# Patient Record
Sex: Female | Born: 1975
Health system: Southern US, Community
[De-identification: ages and names within clinical notes are randomized; demographics above are authoritative.]

## PROBLEM LIST (undated history)

## (undated) DIAGNOSIS — F429 Obsessive-compulsive disorder, unspecified: Secondary | ICD-10-CM

## (undated) DIAGNOSIS — Z8619 Personal history of other infectious and parasitic diseases: Secondary | ICD-10-CM

## (undated) DIAGNOSIS — F32A Depression, unspecified: Secondary | ICD-10-CM

## (undated) DIAGNOSIS — M419 Scoliosis, unspecified: Secondary | ICD-10-CM

## (undated) DIAGNOSIS — S62102A Fracture of unspecified carpal bone, left wrist, initial encounter for closed fracture: Secondary | ICD-10-CM

## (undated) DIAGNOSIS — G43909 Migraine, unspecified, not intractable, without status migrainosus: Secondary | ICD-10-CM

## (undated) DIAGNOSIS — F329 Major depressive disorder, single episode, unspecified: Secondary | ICD-10-CM

## (undated) DIAGNOSIS — F419 Anxiety disorder, unspecified: Secondary | ICD-10-CM

## (undated) DIAGNOSIS — Z8719 Personal history of other diseases of the digestive system: Secondary | ICD-10-CM

## (undated) HISTORY — PX: UPPER GI ENDOSCOPY: SHX6162

## (undated) HISTORY — PX: WISDOM TOOTH EXTRACTION: SHX21

## (undated) HISTORY — DX: Obsessive-compulsive disorder, unspecified: F42.9

## (undated) HISTORY — PX: PILONIDAL CYST EXCISION: SHX744

## (undated) HISTORY — DX: Scoliosis, unspecified: M41.9

## (undated) HISTORY — DX: Fracture of unspecified carpal bone, left wrist, initial encounter for closed fracture: S62.102A

## (undated) HISTORY — DX: Personal history of other infectious and parasitic diseases: Z86.19

## (undated) HISTORY — PX: OTHER SURGICAL HISTORY: SHX169

## (undated) HISTORY — PX: DILATION AND CURETTAGE OF UTERUS: SHX78

## (undated) HISTORY — DX: Personal history of other diseases of the digestive system: Z87.19

## (undated) HISTORY — DX: Migraine, unspecified, not intractable, without status migrainosus: G43.909

---

## 1999-08-07 ENCOUNTER — Other Ambulatory Visit: Admission: RE | Admit: 1999-08-07 | Discharge: 1999-08-07 | Payer: Self-pay | Admitting: Internal Medicine

## 2000-10-10 ENCOUNTER — Other Ambulatory Visit: Admission: RE | Admit: 2000-10-10 | Discharge: 2000-10-10 | Payer: Self-pay | Admitting: Family Medicine

## 2005-02-03 ENCOUNTER — Ambulatory Visit: Payer: Self-pay | Admitting: Family Medicine

## 2005-03-03 ENCOUNTER — Ambulatory Visit: Payer: Self-pay | Admitting: Family Medicine

## 2005-09-02 ENCOUNTER — Ambulatory Visit: Payer: Self-pay | Admitting: Family Medicine

## 2006-03-21 ENCOUNTER — Ambulatory Visit: Payer: Self-pay | Admitting: Family Medicine

## 2006-04-20 ENCOUNTER — Ambulatory Visit: Payer: Self-pay | Admitting: Family Medicine

## 2006-08-29 ENCOUNTER — Ambulatory Visit: Payer: Self-pay | Admitting: Family Medicine

## 2007-01-31 ENCOUNTER — Ambulatory Visit: Payer: Self-pay | Admitting: Family Medicine

## 2007-06-12 ENCOUNTER — Ambulatory Visit: Payer: Self-pay | Admitting: Family Medicine

## 2009-04-13 ENCOUNTER — Emergency Department: Payer: Self-pay | Admitting: Emergency Medicine

## 2009-04-17 ENCOUNTER — Ambulatory Visit: Payer: Self-pay | Admitting: Unknown Physician Specialty

## 2009-10-24 ENCOUNTER — Ambulatory Visit: Payer: Self-pay | Admitting: Family Medicine

## 2009-10-24 DIAGNOSIS — Z8719 Personal history of other diseases of the digestive system: Secondary | ICD-10-CM | POA: Insufficient documentation

## 2009-10-24 DIAGNOSIS — B2790 Infectious mononucleosis, unspecified without complication: Secondary | ICD-10-CM | POA: Insufficient documentation

## 2009-10-24 DIAGNOSIS — M4120 Other idiopathic scoliosis, site unspecified: Secondary | ICD-10-CM | POA: Insufficient documentation

## 2009-10-24 DIAGNOSIS — F411 Generalized anxiety disorder: Secondary | ICD-10-CM | POA: Insufficient documentation

## 2009-10-24 DIAGNOSIS — F341 Dysthymic disorder: Secondary | ICD-10-CM

## 2009-10-28 ENCOUNTER — Telehealth (INDEPENDENT_AMBULATORY_CARE_PROVIDER_SITE_OTHER): Payer: Self-pay | Admitting: Internal Medicine

## 2009-11-25 ENCOUNTER — Ambulatory Visit: Payer: Self-pay | Admitting: Family Medicine

## 2009-11-25 ENCOUNTER — Telehealth (INDEPENDENT_AMBULATORY_CARE_PROVIDER_SITE_OTHER): Payer: Self-pay | Admitting: Internal Medicine

## 2009-12-25 ENCOUNTER — Encounter: Payer: Self-pay | Admitting: Family Medicine

## 2009-12-25 ENCOUNTER — Ambulatory Visit: Payer: Self-pay | Admitting: Family Medicine

## 2009-12-25 DIAGNOSIS — R5381 Other malaise: Secondary | ICD-10-CM | POA: Insufficient documentation

## 2009-12-25 DIAGNOSIS — R5383 Other fatigue: Secondary | ICD-10-CM | POA: Insufficient documentation

## 2009-12-25 LAB — CONVERTED CEMR LAB
BUN: 9 mg/dL (ref 6–23)
Basophils Absolute: 0 10*3/uL (ref 0.0–0.1)
Chloride: 98 meq/L (ref 96–112)
Glucose, Bld: 85 mg/dL (ref 70–99)
HCT: 41.1 % (ref 36.0–46.0)
Lymphs Abs: 2.7 10*3/uL (ref 0.7–4.0)
MCV: 98.2 fL (ref 78.0–100.0)
Monocytes Absolute: 0.6 10*3/uL (ref 0.1–1.0)
Platelets: 337 10*3/uL (ref 150.0–400.0)
Potassium: 4.2 meq/L (ref 3.5–5.1)
RDW: 11.6 % (ref 11.5–14.6)
TSH: 2.57 microintl units/mL (ref 0.35–5.50)

## 2009-12-29 ENCOUNTER — Ambulatory Visit: Payer: Self-pay | Admitting: Family Medicine

## 2009-12-29 ENCOUNTER — Telehealth: Payer: Self-pay | Admitting: Family Medicine

## 2009-12-29 DIAGNOSIS — G43809 Other migraine, not intractable, without status migrainosus: Secondary | ICD-10-CM | POA: Insufficient documentation

## 2010-01-02 ENCOUNTER — Telehealth: Payer: Self-pay | Admitting: Family Medicine

## 2010-01-07 ENCOUNTER — Encounter: Payer: Self-pay | Admitting: Family Medicine

## 2010-01-28 ENCOUNTER — Encounter: Payer: Self-pay | Admitting: Family Medicine

## 2010-02-18 ENCOUNTER — Ambulatory Visit: Payer: Self-pay | Admitting: Family Medicine

## 2010-02-25 ENCOUNTER — Telehealth: Payer: Self-pay | Admitting: Family Medicine

## 2010-02-25 ENCOUNTER — Ambulatory Visit: Payer: Self-pay | Admitting: Family Medicine

## 2010-02-25 DIAGNOSIS — N926 Irregular menstruation, unspecified: Secondary | ICD-10-CM | POA: Insufficient documentation

## 2010-02-26 ENCOUNTER — Ambulatory Visit: Payer: Self-pay | Admitting: Family Medicine

## 2010-02-26 ENCOUNTER — Encounter: Payer: Self-pay | Admitting: Family Medicine

## 2010-03-03 ENCOUNTER — Ambulatory Visit: Payer: Self-pay | Admitting: Internal Medicine

## 2010-03-03 DIAGNOSIS — M549 Dorsalgia, unspecified: Secondary | ICD-10-CM | POA: Insufficient documentation

## 2010-03-04 ENCOUNTER — Telehealth: Payer: Self-pay | Admitting: Internal Medicine

## 2010-03-04 ENCOUNTER — Encounter: Payer: Self-pay | Admitting: Internal Medicine

## 2010-04-17 ENCOUNTER — Telehealth: Payer: Self-pay | Admitting: Family Medicine

## 2010-05-01 ENCOUNTER — Telehealth: Payer: Self-pay | Admitting: Internal Medicine

## 2010-06-02 ENCOUNTER — Telehealth: Payer: Self-pay | Admitting: Family Medicine

## 2010-07-16 ENCOUNTER — Telehealth: Payer: Self-pay | Admitting: Family Medicine

## 2010-07-21 ENCOUNTER — Ambulatory Visit: Payer: Self-pay | Admitting: Family Medicine

## 2010-07-21 DIAGNOSIS — J019 Acute sinusitis, unspecified: Secondary | ICD-10-CM | POA: Insufficient documentation

## 2010-08-20 ENCOUNTER — Ambulatory Visit: Payer: Self-pay | Admitting: Family Medicine

## 2010-08-20 DIAGNOSIS — I10 Essential (primary) hypertension: Secondary | ICD-10-CM | POA: Insufficient documentation

## 2010-08-20 DIAGNOSIS — R030 Elevated blood-pressure reading, without diagnosis of hypertension: Secondary | ICD-10-CM | POA: Insufficient documentation

## 2010-08-21 ENCOUNTER — Encounter: Payer: Self-pay | Admitting: Family Medicine

## 2010-08-21 LAB — CONVERTED CEMR LAB
BUN: 18 mg/dL (ref 6–23)
Chloride: 107 meq/L (ref 96–112)
Creatinine, Ser: 0.6 mg/dL (ref 0.4–1.2)
GFR calc non Af Amer: 113.42 mL/min (ref >60)

## 2010-09-11 ENCOUNTER — Emergency Department: Payer: Self-pay | Admitting: Emergency Medicine

## 2010-09-21 ENCOUNTER — Encounter: Payer: Self-pay | Admitting: Family Medicine

## 2010-12-28 ENCOUNTER — Ambulatory Visit
Admission: RE | Admit: 2010-12-28 | Discharge: 2010-12-28 | Payer: Self-pay | Source: Home / Self Care | Attending: Family Medicine | Admitting: Family Medicine

## 2010-12-28 ENCOUNTER — Encounter: Payer: Self-pay | Admitting: Family Medicine

## 2010-12-28 DIAGNOSIS — F4010 Social phobia, unspecified: Secondary | ICD-10-CM | POA: Insufficient documentation

## 2011-01-08 ENCOUNTER — Telehealth: Payer: Self-pay | Admitting: Family Medicine

## 2011-01-14 NOTE — Progress Notes (Signed)
Summary: refill request for vicodin  Phone Note Refill Request Call back at Home Phone [phone] Message from:  Patient  Refills Requested: Medication #1:  VICODIN 5-500 MG TABS 1 tab every 6 hours as needed for pain Phoned request from pt, she says she takes this for menstrual pain.  Uses [facility] [address].  Please let pt know.  Initial call taken by: Lowella Petties CMA,  Apr 17, 2010 11:28 AM  Follow-up for Phone Call        Medication phoned to pharmacy. Patient Advised.  Follow-up by: Delilah Shan CMA Duncan Dull),  Apr 17, 2010 2:36 PM    Prescriptions: VICODIN 5-500 MG TABS (HYDROCODONE-ACETAMINOPHEN) 1 tab every 6 hours as needed for pain  #30 x 0   Entered and Authorized by:   Ruthe Mannan MD   Signed by:   Ruthe Mannan MD on 04/17/2010   Method used:   Historical   RxID:   [id number]

## 2011-01-14 NOTE — Progress Notes (Signed)
Summary: ? referral to psychiatrist  Phone Note Call from Patient Call back at Home Phone [phone]   Caller: Patient Call For: Ruthe Mannan MD Summary of Call: Pt is seeing a therapist who recommends pt see a psychiatrist to manage her anxiety meds.  Pt is asking if she needs to do that or can you manage her meds.  If not, she will need a referral to psychiatrist in [place], and, do you want to see her first? Initial call taken by: Lowella Petties CMA,  June 02, 2010 12:16 PM  Follow-up for Phone Call        I would like to see her first. Please schedule a 30 min appt to discuss. Ruthe Mannan MD  June 02, 2010 1:09 PM   Additional Follow-up for Phone Call Additional follow up Details #1::        Appt made. Additional Follow-up by: Lowella Petties CMA,  June 02, 2010 2:04 PM

## 2011-01-14 NOTE — Progress Notes (Signed)
Summary: Terrible headache  Phone Note Call from Patient Call back at [phone]   Caller: Patient Call For: Dr. Dayton Martes Summary of Call: Patient was in last week and had lab work done. Patient went back to work this morning and had to leave because of a terrible headache and feeling fatigued. Patient wants to know if you have gotten her lab work back? Patient wants to know if she needs to come back in to see you or what should she do? Initial call taken by: Sydell Axon LPN,  December 29, 2009 8:48 AM  Follow-up for Phone Call        All labs were normal, results were called to phone tree.  She can come back in if not feeling well. Follow-up by: Ruthe Mannan MD,  December 29, 2009 8:55 AM  Additional Follow-up for Phone Call Additional follow up Details #1::        Patient Advised.   Call transferred to [facility] to make appt. Additional Follow-up by: Delilah Shan CMA (AAMA),  December 29, 2009 9:07 AM

## 2011-01-14 NOTE — Letter (Signed)
Summary: Generic Letter  [place] at [facility]  [address] [place], [place] [address]   Phone: [phone]  Fax: [phone]    08/21/2010  Bryony BAKKEN [address] [place], [place]  [address]  Dear Ms. Marciel,   We have received your lab results and your Electrolytes and Kidney function are normal.  Enclosed you will find a copy of your lab results.         Sincerely,       Ruthe Mannan, MD

## 2011-01-14 NOTE — Assessment & Plan Note (Signed)
Summary: BACK PAIN NOT DOING ANY BETTER/RBH   Vital Signs:  Patient profile:   35 year old female Height:      62.5 inches Weight:      213.38 pounds BMI:     38.54 Temp:     98.8 degrees F oral Pulse rate:   80 / minute Pulse rhythm:   regular BP sitting:   108 / 82  (left arm) Cuff size:   large  Vitals Entered By: Delilah Shan CMA Duncan Dull) (February 25, 2010 11:30 AM) CC: Back pain no better   History of Present Illness: 35 yo here for back pain with menstruation.  Over past few months, periods have been heavier, more irregular. Now also getting severe back pain (central lumbar) during her period. LMP was last week.  Felt this time was the worst. Also has h/o two prior miscarriages, last one in 11/10. No dysuria or vaginal discharge.  Also has chronic low back pain that is mainly on her left side. The pain she describes today that occurs with her periods is very different from her chronic LBP. No radiculopathy.  Current Medications (verified): 1)  Zoloft 50 Mg  Tabs (Sertraline Hcl) .... Take 1 Tablet By Mouth Once A Day 2)  Imitrex 100 Mg Tabs (Sumatriptan Succinate) .Marland Kitchen.. 1 By Mouth At The Onset of Migraine. May Repeat Dose in One Hour If Headache Not Resolved 3)  Topamax 25 Mg Tabs (Topiramate) .... Take 4 Tablets By Mouth At Bedtime 4)  Nabumetone 750 Mg Tabs (Nabumetone) .... As Needed 5)  Vicodin 5-500 Mg Tabs (Hydrocodone-Acetaminophen) .... As Needed 6)  Alprazolam 0.25 Mg Tabs (Alprazolam) .Marland Kitchen.. 1 Tab By Mouth Four Times Daily As Needed For Anxiety.  Allergies: 1)  ! Pcn  Review of Systems      See HPI General:  Denies chills and fever. GI:  Denies bloody stools. GU:  Complains of abnormal vaginal bleeding; denies decreased libido, discharge, dysuria, genital sores, incontinence, urinary frequency, and urinary hesitancy. MS:  Complains of low back pain; denies muscle weakness.  Physical Exam  General:  alert, NAD, Abdomen:  Bowel sounds positive,abdomen soft  and non-tender without masses, organomegaly or hernias noted.   Detailed Back/Spine Exam  Lumbosacral Exam:  Inspection-deformity:    Normal Palpation-spinal tenderness:  Normal Lying Straight Leg Raise:    Right:  negative    Left:  negative Sitting Straight Leg Raise:    Right:  negative    Left:  negative Reverse Straight Leg Raise:    Right:  negative    Left:  negative Patrick's Maneuver:    Right:  negative    Left:  negative Fabere Test:    Right:  negative    Left:  negative   Impression & Recommendations:  Problem # 1:  IRREGULAR MENSTRUAL CYCLE (ICD-626.4) Assessment New with low back pain. Given h/o infertility, miscarriages and changes in menstruation, I do think this warrants further work up. Fibroids vs endometriosis. Will get tranvaginal/pelvic ultrasound but may need Gyn referral as well. May also be a component of PCOS.  No signs of glucose intolerance at this point. Orders: Radiology Referral (Radiology)  Complete Medication List: 1)  Zoloft 50 Mg Tabs (Sertraline hcl) .... Take 1 tablet by mouth once a day 2)  Imitrex 100 Mg Tabs (Sumatriptan succinate) .Marland Kitchen.. 1 by mouth at the onset of migraine. may repeat dose in one hour if headache not resolved 3)  Topamax 25 Mg Tabs (Topiramate) .... Take 4 tablets by mouth  at bedtime 4)  Nabumetone 750 Mg Tabs (Nabumetone) .... As needed 5)  Vicodin 5-500 Mg Tabs (Hydrocodone-acetaminophen) .... As needed 6)  Alprazolam 0.25 Mg Tabs (Alprazolam) .Marland Kitchen.. 1 tab by mouth four times daily as needed for anxiety.  Patient Instructions: 1)  Please stop by to see Shirlee Limerick on your way out. Prescriptions: VICODIN 5-500 MG TABS (HYDROCODONE-ACETAMINOPHEN) as needed  #30 x 0   Entered and Authorized by:   Ruthe Mannan MD   Signed by:   Ruthe Mannan MD on 02/25/2010   Method used:   Print then Give to Patient   RxID:   [id number]   Current Allergies (reviewed today): ! PCN

## 2011-01-14 NOTE — Progress Notes (Signed)
Summary: No show for P/T appt  Phone Note Outgoing Call   Summary of Call: No showed for P/T appt.Marland KitchenMarland KitchenCalled pt na.Daine Gip  May 01, 2010 2:40 PM Initial call taken by: Daine Gip,  May 01, 2010 2:40 PM  Follow-up for Phone Call        noted Follow-up by: Cindee Salt MD,  May 01, 2010 3:05 PM

## 2011-01-14 NOTE — Assessment & Plan Note (Signed)
Summary: BACK PROBLEMS/ ALC   Vital Signs:  Patient profile:   35 year old female Weight:      211 pounds Temp:     98.4 degrees F oral Pulse rate:   60 / minute Pulse rhythm:   regular BP sitting:   140 / 80  (left arm) Cuff size:   large  Vitals Entered By: Mervin Hack CMA Duncan Dull) (March 03, 2010 11:49 AM) CC: back pain   History of Present Illness: Back "is killing me" Goes back to 3/9 and it started with her menses  Now menses done but the back pain has continued seen by Dr Dayton Martes had ultrasound---just ovarian cyst  back pain is worsening Trying topical lotion (like bengay) Mom puts it on and has noted a bulge there  Pain if sitting, lying, standing---never comfortable has tried back stretching  "no pain tolerance"--will actually make her nauseated  Tries to limit vicodin to one per day  No leg weakness No radiation of pain up back or down legs---just across back at same level  Allergies: 1)  ! Pcn  Past History:  Past medical, surgical, family and social histories (including risk factors) reviewed for relevance to current acute and chronic problems.  Past Medical History: Reviewed history from 10/24/2009 and no changes required. Pancreatitis, hx of Scoliosis (mild) mononucleosis as a teenager OCD  Past Surgical History: Reviewed history from 10/24/2009 and no changes required. Left wrist fx. Pilonidal cyst  Family History: Reviewed history from 10/24/2009 and no changes required. Father:  Mother: --depression MGM--depression Siblings:   Social History: Reviewed history from 10/24/2009 and no changes required. 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   Msk:  exquisite tenderness over lumbar spine and lumbar  paraspinals bilaterally (seems to be her pain sensitivity rather than true point tenderness) SLR negative bilat Neurologic:  strength normal in all extremities.   Gait slow but normal   Impression & Recommendations:  Problem # 1:  BACK PAIN (ICD-724.5) Assessment Comment Only  ongoing needs to avoid pain meds best in AM and worsens during day--may have some spasm  will try chlorzoxazone (shouldn't have rebound headache from this) will make PT referral  Her updated medication list for this problem includes:    Nabumetone 750 Mg Tabs (Nabumetone) .Marland Kitchen... As needed    Vicodin 5-500 Mg Tabs (Hydrocodone-acetaminophen) .Marland Kitchen... 1 tab every 6 hours as needed for pain    Chlorzoxazone 500 Mg Tabs (Chlorzoxazone) .Marland Kitchen... 1 tab by mouth three times a day as needed for muscle spasm  Orders: Physical Therapy Referral (PT)  Complete Medication List: 1)  Zoloft 50 Mg Tabs (Sertraline hcl) .... Take 1 tablet by mouth once a day 2)  Imitrex 100 Mg Tabs (Sumatriptan succinate) .Marland Kitchen.. 1 by mouth at the onset of migraine. may repeat dose in one hour if headache not resolved 3)  Topamax 25 Mg Tabs (Topiramate) .... Take 4 tablets by mouth at bedtime 4)  Nabumetone 750 Mg Tabs (Nabumetone) .... As needed 5)  Vicodin 5-500 Mg Tabs (Hydrocodone-acetaminophen) .Marland Kitchen.. 1 tab every 6 hours as needed for pain 6)  Alprazolam 0.25 Mg Tabs (Alprazolam) .Marland Kitchen.. 1 tab by mouth four times daily as needed for anxiety. 7)  Chlorzoxazone 500 Mg Tabs (Chlorzoxazone) .Marland Kitchen.. 1 tab by mouth  three times a day as needed for muscle spasm  Patient Instructions: 1)  Please schedule a follow-up appointment as needed .  2)  Referral Appointment Information 3)  Day/Date: 4)  Time: 5)  Place/MD: 6)  Address: 7)  Phone/Fax: 8)  Patient given appointment information. Information/Orders faxed/mailed. Prescriptions: CHLORZOXAZONE 500 MG TABS (CHLORZOXAZONE) 1 tab by mouth three times a day as needed for muscle spasm  #60 x 0   Entered and  Authorized by:   Cindee Salt MD   Signed by:   Cindee Salt MD on 03/03/2010   Method used:   Electronically to        [facility] [address] (retail)       [address], [address]       [place], [place]  [address]       Ph: [phone]       Fax: [phone]   RxID:   [id number]   Current Allergies (reviewed today): ! PCN

## 2011-01-14 NOTE — Progress Notes (Signed)
Summary: Needs note  Phone Note Call from Patient Call back at Home Phone [phone]   Caller: Patient Call For: Dr. Alphonsus Sias Summary of Call: pt would like for you to "write" her out of work for Tuesday 03/03/2010 and Friday 03/06/2010, her normal days off are Wednesday and Thursday,  pt needs this note to use for FMLA. Pt states she works at [employer] and sits down 10 hours a day and not able to get up, she states her appt with "rehab" is Wednesday? Please advise. Initial call taken by: Mervin Hack CMA Duncan Dull),  March 04, 2010 10:13 AM  Follow-up for Phone Call        okay to write note for the 2 days return to work date Saturday the 26th Follow-up by: Cindee Salt MD,  March 04, 2010 11:00 AM  Additional Follow-up for Phone Call Additional follow up Details #1::        spoke with pt and she will pick up work note, also pt wants to to fill out FMLA for her being out this week. Please advise. DeShannon Smith CMA Duncan Dull)  March 04, 2010 2:43 PM   per Dr. Dayton Martes, she has spoken to pt about FMLA and Dr. Dayton Martes declined. She did fill out paperwork in January for [employer] for temp disability. DeShannon Smith CMA Duncan Dull)  March 04, 2010 4:39 PM   I agree. I was willing to say she was hurt, but FMLA papers are another thing. If they have to be done (and some jobs requiire it) there will be an extra charge to do them Cindee Salt MD  March 05, 2010 4:58 PM   spoke with pt and she is willing to pay the charge. DeShannon Smith CMA Duncan Dull)  March 06, 2010 9:06 AM   I will fill out the papers for the 2 days we agreed to have her out and no more than that Have her fax or drop by the papers--I can probably get to them  by the middle of the week Cindee Salt MD  March 06, 2010 5:39 PM   left message with results, will wait for pt to drop forms off. Additional Follow-up by: Mervin Hack CMA Duncan Dull),  March 10, 2010 11:35 AM

## 2011-01-14 NOTE — Assessment & Plan Note (Signed)
Summary: constant headache/alc   Vital Signs:  Patient profile:   35 year old female Height:      62.5 inches Weight:      218.25 pounds BMI:     39.42 Temp:     97.8 degrees F oral Pulse rate:   76 / minute Pulse rhythm:   regular BP sitting:   102 / 60  (right arm) Cuff size:   large  Vitals Entered By: Linde Gillis CMA Duncan Dull) (July 21, 2010 3:51 PM) CC: headaches   History of Present Illness: has a new headache  this does not feel like a migraine  has had it since last thursday -- but wed was the peak  had to sleep with the heating pad on her head  feels clammy and sweaty and nauseated -- this comes in waves  is not constant - but worse when she gets up and moves around   usually the migraine is just pain/ sens to light and sound and some nausea  no vomiting   the nausea is worse - and eating makes it worse   bp is no higher than normal   just ended period  usually gets more headaches when she is on it - but never this long   has taken vicodin for this headache  imitrex makes it worse -- so does not take it  took nabumetone -- did not help   also feels a lot of pressure in her head - has never had before  whole head (migraine is usually one sided )   feels good to push on her head   could be a sinus infection - is wondering about that  does have pressure - especially in cheeks and behind eyes  gritting her teeth a lot  in am quite congested -- ? allergies  some green drainage - all in the am  snoring is worse too    not been sleepig well -- more stress from that   is itching a bit and has a rash  ? if from the vicodin  no muscle relaxer lately        Allergies: 1)  ! Pcn  Past History:  Past Medical History: Last updated: 10/24/2009 Pancreatitis, hx of Scoliosis (mild) mononucleosis as a teenager OCD  Past Surgical History: Last updated: 10/24/2009 Left wrist fx. 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 Eyes:  Denies blurring, discharge, and eye irritation. CV:  Denies chest pain or discomfort, palpitations, and swelling of feet. Resp:  Denies cough and wheezing. GI:  Denies abdominal pain, change in bowel habits, and indigestion. MS:  Denies cramps and muscle weakness. Derm:  Denies itching, lesion(s), poor wound healing, and rash. Neuro:  Complains of headaches; denies numbness, tingling, tremors, and weakness. Psych:  mood is generally ok . Heme:  Denies abnormal bruising and bleeding.  Physical Exam  General:  overweight but generally well appearing  Head:  normocephalic, atraumatic, and no abnormalities observed.  bilat ethmoid/ frontal and maxillary sinus tenderness no temporal tenderness  Eyes:  vision grossly intact, pupils equal, pupils round, and pupils reactive to light.  no conjunctival pallor, injection or icterus  Ears:  R ear  normal and L ear normal.   Nose:  nares are injected and congested bilaterally  Mouth:  pharynx pink and moist.   Neck:  No deformities, masses, or tenderness noted. Lungs:  Normal respiratory effort, chest expands symmetrically. Lungs are clear to auscultation, no crackles or wheezes. Heart:  Normal rate and regular rhythm. S1 and S2 normal without gallop, murmur, click, rub or other extra sounds. Msk:  No deformity or scoliosis noted of thoracic or lumbar spine.   Pulses:  R and L carotid,radial,femoral,dorsalis pedis and posterior tibial pulses are full and equal bilaterally Extremities:  No clubbing, cyanosis, edema, or deformity noted with normal full range of motion of all joints.   Neurologic:  alert &  oriented X3, cranial nerves II-XII intact, strength normal in all extremities, sensation intact to light touch, gait normal, DTRs symmetrical and normal, finger-to-nose normal, and Romberg negative.   Skin:  Intact without suspicious lesions or rashes Cervical Nodes:  No lymphadenopathy noted Psych:  normal affect, talkative and pleasant -- but is in discomfort   Impression & Recommendations:  Problem # 1:  SINUSITIS - ACUTE-NOS (ICD-461.9) Assessment New pcn allergic  tx with zithromax  toradol inj for headache  Her updated medication list for this problem includes:    Zithromax Z-pak 250 Mg Tabs (Azithromycin) .Marland Kitchen... Take by mouth as directed  Problem # 2:  MIGRAINE VARIANT (ICD-346.20) Assessment: Deteriorated  suspect this is being triggered by above  will try 60 mg toradol injection - hopefully to break cycle  if not improved after tx of sinusitis-will f/u with her neurologist  Her updated medication list for this problem includes:    Imitrex 100 Mg Tabs (Sumatriptan succinate) .Marland Kitchen... 1 by mouth at the onset of migraine. may repeat dose in one hour if headache not resolved    Nabumetone 750 Mg Tabs (Nabumetone) .Marland Kitchen... As needed    Vicodin 5-500 Mg Tabs (Hydrocodone-acetaminophen) .Marland Kitchen... 1 tab every 6 hours as needed for pain  Orders: Ketorolac-Toradol 15mg  (Z6109) Admin of Therapeutic Inj  intramuscular or subcutaneous ([address])  Complete Medication List: 1)  Zoloft 50 Mg Tabs (Sertraline hcl) .... Take 1 tablet by mouth once a day 2)  Imitrex 100 Mg Tabs (Sumatriptan succinate) .Marland Kitchen.. 1 by mouth at the onset of migraine. may repeat dose in one hour if headache not resolved 3)  Topamax 25 Mg Tabs (Topiramate) .... Take 4 tablets by mouth at bedtime 4)  Nabumetone 750 Mg Tabs (Nabumetone) .... As needed 5)  Vicodin 5-500 Mg Tabs (Hydrocodone-acetaminophen) .Marland Kitchen.. 1 tab every 6 hours as needed for pain 6)  Alprazolam 0.25 Mg Tabs (Alprazolam) .Marland Kitchen.. 1 tab by mouth four times daily as  needed for anxiety. 7)  Chlorzoxazone 500 Mg Tabs (Chlorzoxazone) .Marland Kitchen.. 1 tab by mouth three times a day as needed for muscle spasm 8)  Zithromax Z-pak 250 Mg Tabs (Azithromycin) .... Take by mouth as directed  Patient Instructions: 1)  drink lots of fluids - sip fluids all day long  2)  use dove soap to bathe  3)  use lubriderm or eucerin moisturizer on arms  4)  use nasal saline spray to flush sinuses (or netti pot )  5)  take zithromax as directed for sinus infection 6)  shot of toradol today (anti inflammatory)  7)  if headache gets severe -- go to ER 8)  if not improved after the zithromax - call Dr Glade Stanford office for follow up  Prescriptions: ZITHROMAX Z-PAK 250 MG TABS (AZITHROMYCIN) take by mouth  as directed  #1 pack x 0   Entered and Authorized by:   Judith Part MD   Signed by:   Judith Part MD on 07/21/2010   Method used:   Electronically to        [facility] [address] (retail)       [address], [address]       [place], [place]  [address]       Ph: [phone]       Fax: [phone]   RxID:   [id number]   Current Allergies (reviewed today): ! PCN   Medication Administration  Injection # 1:    Medication: Ketorolac-Toradol 15mg     Diagnosis: MIGRAINE VARIANT (ICD-346.20)    Route: IM    Site: L deltoid    Exp Date: 02/11/2012    Lot #: [id number]    Mfr: Novaplus    Comments: Patient requested that this medication be given to her in the arm    Patient tolerated injection without complications    Given by: Linde Gillis CMA Duncan Dull) (July 21, 2010 5:05 PM)  Orders Added: 1)  Ketorolac-Toradol 15mg  [J1885] 2)  Admin of Therapeutic Inj  intramuscular or subcutaneous [96372] 3)  Est. Patient Level IV [23557]

## 2011-01-14 NOTE — Progress Notes (Signed)
Summary: Med Leave Extended  Phone Note Call from Patient Call back at Home Phone [phone]   Caller: Patient Call For: Dr. Dayton Martes Complaint: Abdominal Pain Summary of Call: Imitrex making her nauseous and has diarrhea real bad.  [employer] is asking for information to extend her leave from work.  They need  OV's showing the course that she is taking to resolve the headaches and her next appointment faxed to [phone]   Claim # [id number].  Is it okay to do this? Initial call taken by: Delilah Shan CMA Duncan Dull),  January 02, 2010 9:41 AM  Follow-up for Phone Call        yes. Follow-up by: Ruthe Mannan MD,  January 02, 2010 9:42 AM  Additional Follow-up for Phone Call Additional follow up Details #1::        info faxed to above Number with Case # identified. Additional Follow-up by: Delilah Shan CMA (AAMA),  January 02, 2010 9:59 AM

## 2011-01-14 NOTE — Letter (Signed)
Summary: [facility]  [facility]   Imported By: Lanelle Bal 02/06/2010 09:11:51  _____________________________________________________________________  External Attachment:    Type:   Image     Comment:   External Document

## 2011-01-14 NOTE — Consult Note (Signed)
Summary: [facility]  [facility]   Imported By: Maryln Gottron 01/19/2010 15:41:23  _____________________________________________________________________  External Attachment:    Type:   Image     Comment:   External Document

## 2011-01-14 NOTE — Letter (Signed)
Summary: Out of Work  [facility] [employer] [facility] at [facility]  [address] [place], [place] [address]   Phone: [phone]  Fax: [phone]    March 04, 2010   Employee:  Hannah Stephenson    To Whom It May Concern:   For Medical reasons, please excuse the above named employee from work for the following dates:  Start:   03/03/2010  End:   03/07/2010  If you need additional information, please feel free to contact our office.         Sincerely,      Tillman Abide, MD

## 2011-01-14 NOTE — Assessment & Plan Note (Signed)
Summary: DISCUSS MED FOR ANXIETY   Vital Signs:  Patient profile:   35 year old female Height:      62.5 inches Weight:      211 pounds BMI:     38.11 Temp:     98.7 degrees F oral Pulse rate:   92 / minute Pulse rhythm:   regular BP sitting:   102 / 76  (left arm) Cuff size:   large  Vitals Entered By: Delilah Shan CMA Duncan Dull) (February 18, 2010 11:48 AM) CC: Discuss medication for anxiety   History of Present Illness: Here for f.u headaches and anxiety.    HA- referred her to Dr. Clarisse Gouge (HA center) last office vist. She is now on Topomax 100 mg at bedtime and Relafen 750 mg qid prn.  Has not had a HA in over a week.  Feels like a new person.  Anxiety- better on Zoloft.  Dr. Clarisse Gouge mentioned increasing  her Zoloft (currently on 50 mg daily) as she does get panic attacks.  Last panic attack this weekend, gets one every few months.  Usually after driving or in a crowded area.  No SI or HI and depressive symptoms much improved. She feels great but still has panic attacks from time to time.  Current Medications (verified): 1)  Zoloft 50 Mg  Tabs (Sertraline Hcl) .... Take 1 Tablet By Mouth Once A Day 2)  Imitrex 100 Mg Tabs (Sumatriptan Succinate) .Marland Kitchen.. 1 By Mouth At The Onset of Migraine. May Repeat Dose in One Hour If Headache Not Resolved 3)  Topamax 25 Mg Tabs (Topiramate) .... Take 4 Tablets By Mouth At Bedtime 4)  Nabumetone 750 Mg Tabs (Nabumetone) .... As Needed 5)  Vicodin 5-500 Mg Tabs (Hydrocodone-Acetaminophen) .... As Needed 6)  Alprazolam 0.25 Mg Tabs (Alprazolam) .Marland Kitchen.. 1 Tab By Mouth Four Times Daily As Needed For Anxiety.  Allergies: 1)  ! Pcn  Review of Systems      See HPI General:  Denies chills and fever. Eyes:  Denies blurring. ENT:  Denies decreased hearing. CV:  Denies chest pain or discomfort. GI:  Denies abdominal pain. MS:  Denies muscle weakness. Neuro:  Denies headaches, seizures, sensation of room spinning, tingling, tremors, visual disturbances, and  weakness. Psych:  Complains of anxiety; denies depression, sense of great danger, suicidal thoughts/plans, thoughts of violence, unusual visions or sounds, and thoughts /plans of harming others.  Physical Exam  General:  alert, NAD, smiling, looks much better! Ears:  External ear exam shows no significant lesions or deformities.  Otoscopic examination reveals clear canals, tympanic membranes are intact bilaterally without bulging, retraction, inflammation or discharge. Hearing is grossly normal bilaterally. Mouth:  MMM Lungs:  Normal respiratory effort, chest expands symmetrically. Lungs are clear to auscultation, no crackles or wheezes. Heart:  Normal rate and regular rhythm. S1 and S2 normal without gallop, murmur, click, rub or other extra sounds. Psych:  Cognition and judgment appear intact. Alert and cooperative with normal attention span and concentration. No apparent delusions, illusions, hallucinations   Impression & Recommendations:  Problem # 1:  MIGRAINE VARIANT (ICD-346.20) Assessment Improved Much improved.  Continue current meds and recs per Dr. Clarisse Gouge. The following medications were removed from the medication list:    Ibuprofen 200 Mg Tabs (Ibuprofen) ..... Otc as directed.    Excedrin Migraine 250-250-65 Mg Tabs (Aspirin-acetaminophen-caffeine)    Excedrin Tension Headache 500-65 Mg Tabs (Acetaminophen-caffeine) Her updated medication list for this problem includes:    Imitrex 100 Mg Tabs (Sumatriptan  succinate) .Marland Kitchen... 1 by mouth at the onset of migraine. may repeat dose in one hour if headache not resolved    Nabumetone 750 Mg Tabs (Nabumetone) .Marland Kitchen... As needed    Vicodin 5-500 Mg Tabs (Hydrocodone-acetaminophen) .Marland Kitchen... As needed  Problem # 2:  ANXIETY DEPRESSION (ICD-300.4) Assessment: Improved Time spent with patient 25 minutes, more than 50% of this time was spent counseling patient on anxiety and depression.  She does not want to increase Zoloft because on most days she  feels great.  Does not want to be overly medicated.  Discussed adding low dose benzo as needed.  Given patient handout about xanax and discussed addiction potential.  She would like to try Xanax for breakthrough panic attacks.  Follow up in one month.  Complete Medication List: 1)  Zoloft 50 Mg Tabs (Sertraline hcl) .... Take 1 tablet by mouth once a day 2)  Imitrex 100 Mg Tabs (Sumatriptan succinate) .Marland Kitchen.. 1 by mouth at the onset of migraine. may repeat dose in one hour if headache not resolved 3)  Topamax 25 Mg Tabs (Topiramate) .... Take 4 tablets by mouth at bedtime 4)  Nabumetone 750 Mg Tabs (Nabumetone) .... As needed 5)  Vicodin 5-500 Mg Tabs (Hydrocodone-acetaminophen) .... As needed 6)  Alprazolam 0.25 Mg Tabs (Alprazolam) .Marland Kitchen.. 1 tab by mouth four times daily as needed for anxiety. Prescriptions: ALPRAZOLAM 0.25 MG TABS (ALPRAZOLAM) 1 tab by mouth four times daily as needed for anxiety.  #30 x 0   Entered and Authorized by:   Ruthe Mannan MD   Signed by:   Ruthe Mannan MD on 02/18/2010   Method used:   Print then Give to Patient   RxID:   [id number] ZOLOFT 50 MG  TABS (SERTRALINE HCL) Take 1 tablet by mouth once a day  #30 x 11   Entered and Authorized by:   Ruthe Mannan MD   Signed by:   Ruthe Mannan MD on 02/18/2010   Method used:   Electronically to        [facility] [address] (retail)       [address]       [place], [place]  [address]       Ph: [phone]       Fax: [phone]   RxID:   [id number]   Current Allergies (reviewed today): ! PCN

## 2011-01-14 NOTE — Assessment & Plan Note (Signed)
Summary: FILL OUT PAPERWORK/NT   Vital Signs:  Patient profile:   35 year old female Weight:      221.75 pounds Temp:     99.0 degrees F oral Pulse rate:   92 / minute Pulse rhythm:   regular BP sitting:   116 / 80  (left arm) Cuff size:   large  Vitals Entered By: Selena Batten Dance CMA Duncan Dull) (December 28, 2010 12:04 PM) CC: Paperwork   History of Present Illness: 35 yo with migraines and depression/social anxiety here to fill out FMLA paperwork.  Migraines- sees Dr. Sharene Skeans at [facility] and [facility].  Discussed importance of having her fill out her FMLA paperwork since she is the specialist treating her for this.  Ms. Klindt states that they are filling out the same paperwork but the wanted her PMD to fill them out as well. No longer on narcotics. Taking Baclofen, Nabutone, Topamax. 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 No SI or HI  Depression- deteriorated.  On Prozac 100 mg daily.  Feels it is no longer working.  Depression worsened since her migraines have worsed.  Also has social phobia that has worsened since she has not left her home.  Still seeing a therapist who is also filling out FMLA paperwork as well.  Learning coping strategies.    Current Medications (verified): 1)  Sertraline Hcl 100 Mg Tabs (Sertraline Hcl) .Marland Kitchen.. 1 and 1/2 Tab By Mouth Daily. 2)  Topamax 25 Mg Tabs (Topiramate) .... Take 4 Tablets By Mouth At Bedtime 3)  Nabumetone 750 Mg Tabs (Nabumetone) .... As Needed 4)  Chlorpromazine Hcl 25 Mg Tabs (Chlorpromazine Hcl) .Marland Kitchen.. 1-2 By Mouth As Needed. Repeat If Needed in 2 Hours 5)  Baclofen 10 Mg Tabs (Baclofen) .... 1/2 To 1 By Mouth Three Times A Day As Needed 6)  Ambien 5 Mg Tabs (Zolpidem Tartrate) .Marland Kitchen.. 1 Tab By Mouth At Bedtime As Needed Insomnia  Allergies: 1)  ! Pcn  Past History:  Past Medical History: Last updated: 10/24/2009 Pancreatitis, hx of Scoliosis (mild) mononucleosis  as a teenager OCD  Past Surgical History: Last updated: 10/24/2009 Left wrist fx. Pilonidal cyst  Family History: Last updated: 10/24/2009 Father:  Mother: --depression MGM--depression Siblings:   Social History: Last updated: 10/24/2009 Marital Status: divorced x 68yrs Children: 1--girl age 87 [employer], customer service  Risk Factors: Smoking Status: never (10/24/2009)  Review of Systems      See HPI Psych:  Denies mental problems, panic attacks, sense of great danger, suicidal thoughts/plans, thoughts of violence, unusual visions or sounds, and thoughts /plans of harming others.  Physical Exam  General:  overweight but generally well appearing  Psych:  normal affect, talkative and pleasant but is tearful at time.     Impression & Recommendations:  Problem # 1:  MIGRAINE VARIANT (ICD-346.20) Assessment Deteriorated Time spent with patient 35 minutes, more than 50% of this time was spent discussing her FMLA paperwork and her depression/anxiety.  Forms returned to patient.    The following medications were removed from the medication list:    Imitrex 100 Mg Tabs (Sumatriptan succinate) .Marland Kitchen... 1 by mouth at the onset of migraine. may repeat dose in one hour if headache not resolved    Vicodin 5-500 Mg Tabs (Hydrocodone-acetaminophen) .Marland Kitchen... 1 tab every 6 hours as needed for pain Her updated medication list for this problem includes:    Nabumetone 750 Mg Tabs (Nabumetone) .Marland Kitchen... As  needed  Problem # 2:  ANXIETY DEPRESSION (ICD-300.4) Assessment: Deteriorated Discussed options.  She would prefer to increase the zoloft to 150 mg rather adding second agent because she is fearful it will worsen her migraines.    Complete Medication List: 1)  Sertraline Hcl 100 Mg Tabs (Sertraline hcl) .Marland Kitchen.. 1 and 1/2 tab by mouth daily. 2)  Topamax 25 Mg Tabs (Topiramate) .... Take 4 tablets by mouth at bedtime 3)  Nabumetone 750 Mg Tabs (Nabumetone) .... As needed 4)  Chlorpromazine Hcl 25 Mg  Tabs (Chlorpromazine hcl) .Marland Kitchen.. 1-2 by mouth as needed. repeat if needed in 2 hours 5)  Baclofen 10 Mg Tabs (Baclofen) .... 1/2 to 1 by mouth three times a day as needed 6)  Ambien 5 Mg Tabs (Zolpidem tartrate) .Marland Kitchen.. 1 tab by mouth at bedtime as needed insomnia Prescriptions: AMBIEN 5 MG TABS (ZOLPIDEM TARTRATE) 1 tab by mouth at bedtime as needed insomnia  #30 x 0   Entered and Authorized by:   Ruthe Mannan MD   Signed by:   Ruthe Mannan MD on 12/28/2010   Method used:   Print then Give to Patient   RxID:   [id number] SERTRALINE HCL 100 MG TABS (SERTRALINE HCL) 1 and 1/2 tab by mouth daily.  #60 x 3   Entered and Authorized by:   Ruthe Mannan MD   Signed by:   Ruthe Mannan MD on 12/28/2010   Method used:   Electronically to        [facility] (retail)       [address]       [place], [place]  [address]       Ph: [phone]       Fax: [phone]   RxID:   [id number]    Orders Added: 1)  Est. Patient Level V [64403]    Current Allergies (reviewed today): ! PCN

## 2011-01-14 NOTE — Assessment & Plan Note (Signed)
Summary: CHECK BP/CLE   Vital Signs:  Patient profile:   35 year old female Height:      62.5 inches Weight:      220 pounds BMI:     39.74 Temp:     98.6 degrees F oral Pulse rate:   72 / minute Pulse rhythm:   regular BP sitting:   110 / 80  (right arm) Cuff size:   large  Vitals Entered By: Linde Gillis CMA Duncan Dull) (August 20, 2010 11:45 AM) CC: blood pressure check   History of Present Illness: 35 yo here for BP check.  Ate chinese food last week, triggered a migraine.  Felt "swimmy headed." A few days later, ordered the same meal and same thing happened.  Checked her BP with her dad's BP cuff and it was 140/98.  She denies chest pain, blurred vision, SOB. Concerned she will "stroke out."  Has not returned to HA doctor, does not feel like he listens to her.  Wants [facility] try [facility] in [place].  Current Medications (verified): 1)  Zoloft 50 Mg  Tabs (Sertraline Hcl) .... Take 1 Tablet By Mouth Once A Day 2)  Imitrex 100 Mg Tabs (Sumatriptan Succinate) .Marland Kitchen.. 1 By Mouth At The Onset of Migraine. May Repeat Dose in One Hour If Headache Not Resolved 3)  Topamax 25 Mg Tabs (Topiramate) .... Take 4 Tablets By Mouth At Bedtime 4)  Nabumetone 750 Mg Tabs (Nabumetone) .... As Needed 5)  Vicodin 5-500 Mg Tabs (Hydrocodone-Acetaminophen) .Marland Kitchen.. 1 Tab Every 6 Hours As Needed For Pain 6)  Alprazolam 0.25 Mg Tabs (Alprazolam) .Marland Kitchen.. 1 Tab By Mouth Four Times Daily As Needed For Anxiety.  Allergies: 1)  ! Pcn  Past History:  Past Medical History: Last updated: 10/24/2009 Pancreatitis, hx of Scoliosis (mild) mononucleosis as a teenager OCD  Past Surgical History: Last updated: 10/24/2009 Left wrist fx. 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 Eyes:  Denies blurring. CV:  Denies chest pain or discomfort. Resp:  Denies shortness of breath. Neuro:  Complains of headaches; denies brief paralysis, difficulty with concentration, disturbances in coordination, falling down, inability to speak, memory loss, numbness, poor balance, seizures, sensation of room spinning, tingling, tremors, visual disturbances, and weakness.   Impression & Recommendations:  Problem # 1:  ELEVATED BP READING WITHOUT DX HYPERTENSION (ICD-796.2) Assessment New BP normal in office today.  Advised not eating this meal that continues to cause this.  Keep and eye on it. BMET today. Orders: Venipuncture ([address]) TLB-BMP (Basic Metabolic Panel-BMET) (80048-METABOL)  Problem # 2:  MIGRAINE VARIANT (ICD-346.20) Assessment: Deteriorated Refer to HA and wellness center per pt request. Her updated medication list for this problem includes:    Imitrex 100 Mg Tabs (Sumatriptan succinate) .Marland Kitchen... 1 by mouth at the onset of migraine. may repeat dose in one hour if headache not resolved    Nabumetone 750 Mg Tabs (Nabumetone) .Marland Kitchen... As needed    Vicodin 5-500 Mg Tabs (Hydrocodone-acetaminophen) .Marland Kitchen... 1 tab every 6 hours as needed for pain  Orders: Headache Clinic Referral (Headache)  Complete Medication List: 1)  Zoloft 50 Mg Tabs (Sertraline hcl) .... Take 1 tablet by mouth once a day 2)  Imitrex 100  Mg Tabs (Sumatriptan succinate) .Marland Kitchen.. 1 by mouth at the onset of migraine. may repeat dose in one hour if headache not resolved 3)  Topamax 25 Mg Tabs (Topiramate) .... Take 4 tablets by mouth at bedtime 4)  Nabumetone 750 Mg Tabs (Nabumetone) .... As needed 5)  Vicodin 5-500 Mg Tabs (Hydrocodone-acetaminophen) .Marland Kitchen.. 1 tab every 6 hours as needed for pain 6)  Alprazolam 0.25 Mg Tabs (Alprazolam) .Marland Kitchen.. 1 tab by mouth four times daily as needed for anxiety.  Patient  Instructions: 1)  Please stop by to see Shirlee Limerick on your way out.  Current Allergies (reviewed today): ! PCN

## 2011-01-14 NOTE — Progress Notes (Signed)
Summary: needs new rx for vicodin   Phone Note Call from Patient Call back at Home Phone [phone]   Caller: Patient Call For: Dr. Dayton Martes  Summary of Call: Patient says that when she went to get her vicodin filled she was told by the pharmacy that they could not fill it with the instructions that were on the rx. They told her it needed to say more than take as needed. Patient needs new rx. Please call patient when ready.  Initial call taken by: Melody Comas,  February 25, 2010 4:32 PM Caller: [facility]  Follow-up for Phone Call        In my box for pick up. Follow-up by: Ruthe Mannan MD,  February 26, 2010 8:00 AM  Additional Follow-up for Phone Call Additional follow up Details #1::        Patient Advised. Prescription left at front desk.  Additional Follow-up by: Delilah Shan CMA (AAMA),  February 26, 2010 11:08 AM    New/Updated Medications: VICODIN 5-500 MG TABS (HYDROCODONE-ACETAMINOPHEN) 1 tab every 6 hours as needed for pain Prescriptions: VICODIN 5-500 MG TABS (HYDROCODONE-ACETAMINOPHEN) 1 tab every 6 hours as needed for pain  #30 x 0   Entered and Authorized by:   Ruthe Mannan MD   Signed by:   Ruthe Mannan MD on 02/26/2010   Method used:   Print then Give to Patient   RxID:   [id number]

## 2011-01-14 NOTE — Assessment & Plan Note (Signed)
Summary: HA,FATIGUE/CLE   Vital Signs:  Patient profile:   35 year old female Height:      62.5 inches Weight:      213.25 pounds BMI:     38.52 Temp:     98.7 degrees F oral Pulse rate:   84 / minute Pulse rhythm:   regular BP sitting:   126 / 90  (left arm) Cuff size:   large  Vitals Entered By: Delilah Shan CMA Duncan Dull) (December 29, 2009 3:12 PM) CC: H/A, fatigue   History of Present Illness: Here for persistent headaches. Saw her 4 days ago during her first appointment with me for follow up depression since increasing Zoloft dose.   From that standpoint doing much better.  Also complained of fatigue- CBC, TSH, BMET, VIt D all within normal limits.  HA- has had a daily headache, getting worse.  Usually bilaterally, sometimes frontal, somtimes temporal. Now developing photophobia, nausea, no vomiting. Throbbing in nature. Would like to be in a dark room. Says she had to leave work today because of this headache, staring at her computer makes it worse.  Current Medications (verified): 1)  Zoloft 50 Mg  Tabs (Sertraline Hcl) .... Take 1 Tablet By Mouth Once A Day 2)  Loestrin 24 Fe 1-20 Mg-Mcg Tabs (Norethin Ace-Eth Estrad-Fe) .... As Directed 3)  Ibuprofen 200 Mg Tabs (Ibuprofen) .... Otc As Directed. 4)  Excedrin Pm 500-38 Mg Tabs (Diphenhydramine-Apap (Sleep)) 5)  Excedrin Migraine 250-250-65 Mg Tabs (Aspirin-Acetaminophen-Caffeine) 6)  Excedrin Tension Headache 500-65 Mg Tabs (Acetaminophen-Caffeine) 7)  Imitrex 100 Mg Tabs (Sumatriptan Succinate) .Marland Kitchen.. 1 By Mouth At The Onset of Migraine. May Repeat Dose in One Hour If Headache Not Resolved  Allergies: 1)  ! Pcn  Review of Systems      See HPI General:  Denies fever. Eyes:  Denies blurring. Neuro:  Complains of headaches; denies visual disturbances.  Physical Exam  General:  alert, laying down on table, eyes covered. Eyes:  No corneal or conjunctival inflammation noted. EOMI. Perrla. Funduscopic exam benign,  without hemorrhages, exudates or papilledema. Vision grossly normal. Psych:  normally interactive, depressed affect, flat affect, and subdued.     Impression & Recommendations:  Problem # 1:  MIGRAINE VARIANT (ICD-346.20) Assessment Deteriorated Seems most consistent with multiple types of headaches.  Currently having a migraine, when I saw her in office 4 days ago, seemed more consistent with tension HA. Will give immitrex for abortive therapy. Advised to keep a HA journal and will refer to [facility]. Also advised stopping all NSAIDs as they may be causing over use or rebound headaches. Possible consider topimax in future. Her updated medication list for this problem includes:    Ibuprofen 200 Mg Tabs (Ibuprofen) ..... Otc as directed.    Excedrin Migraine 250-250-65 Mg Tabs (Aspirin-acetaminophen-caffeine)    Excedrin Tension Headache 500-65 Mg Tabs (Acetaminophen-caffeine)    Imitrex 100 Mg Tabs (Sumatriptan succinate) .Marland Kitchen... 1 by mouth at the onset of migraine. may repeat dose in one hour if headache not resolved  Orders: Headache Clinic Referral (Headache)  Complete Medication List: 1)  Zoloft 50 Mg Tabs (Sertraline hcl) .... Take 1 tablet by mouth once a day 2)  Loestrin 24 Fe 1-20 Mg-mcg Tabs (Norethin ace-eth estrad-fe) .... As directed 3)  Ibuprofen 200 Mg Tabs (Ibuprofen) .... Otc as directed. 4)  Excedrin Pm 500-38 Mg Tabs (Diphenhydramine-apap (sleep)) 5)  Excedrin Migraine 250-250-65 Mg Tabs (Aspirin-acetaminophen-caffeine) 6)  Excedrin Tension Headache 500-65 Mg Tabs (Acetaminophen-caffeine) 7)  Imitrex 100 Mg Tabs (Sumatriptan succinate) .Marland Kitchen.. 1 by mouth at the onset of migraine. may repeat dose in one hour if headache not resolved  Patient Instructions: 1)  Please stop by to see marion on your way out. 2)  Call me if you cannot get in to see HA center right away. Prescriptions: IMITREX 100 MG TABS (SUMATRIPTAN SUCCINATE) 1 by mouth at the onset of  migraine. May repeat dose in one hour if headache not resolved  #10 x 0   Entered and Authorized by:   Ruthe Mannan MD   Signed by:   Ruthe Mannan MD on 12/29/2009   Method used:   Electronically to        [facility] (retail)       [address], [address]       [place], [place]  [address]       Ph: [phone]       Fax: [phone]   RxID:   [id number]   Current Allergies (reviewed today): ! PCN

## 2011-01-14 NOTE — Assessment & Plan Note (Signed)
Summary: 1 MONTH FOLLOW UP PER BILLIE/[facility]   Vital Signs:  Patient profile:   35 year old female Height:      62.5 inches Weight:      211.13 pounds BMI:     38.14 Temp:     98.6 degrees F oral Pulse rate:   84 / minute Pulse rhythm:   regular BP sitting:   106 / 86  (left arm) Cuff size:   large  Vitals Entered By: Delilah Shan CMA Duncan Dull) (December 25, 2009 12:26 PM) CC: 1 month follow up   History of Present Illness: Here for 1 mo follow up of depression.  Stopped taking  Effexor on 11/25/2009 and increased Zoloft to 50 mg daily.   From Depression standpoint, she feels she is much better. Less tearful, appetite improved, getting out of house more.  Still has not returned to work because of fatigue.  On FMLA, supposed to return 12/01/2009.    Fatigue--feels totally drained. This has been ongoing for months. She thought it would improve when her depression symptoms improved but it hasnt.  HA- has had a daily headache.  Usually bilaterally, sometimes frontal, somtimes temporal. Has migraines at times, this doesnt feel like those. No photophobia, no nausea, no aura.  Releived with Ibupfrofen.  Current Medications (verified): 1)  Zoloft 50 Mg  Tabs (Sertraline Hcl) .... Take 1 Tablet By Mouth Once A Day 2)  Loestrin 24 Fe 1-20 Mg-Mcg Tabs (Norethin Ace-Eth Estrad-Fe) .... As Directed 3)  Ibuprofen 200 Mg Tabs (Ibuprofen) .... Otc As Directed.  Allergies: 1)  ! Pcn  Review of Systems      See HPI General:  Complains of malaise; denies chills and fever. CV:  Denies shortness of breath with exertion, swelling of feet, and swelling of hands. Endo:  Denies cold intolerance, heat intolerance, and weight change.  Physical Exam  General:  alert, well-developed, well-nourished, and well-hydrated.  NAD Psych:  normally interactive, depressed affect, flat affect, and subdued.     Impression & Recommendations:  Problem # 1:  ANXIETY DEPRESSION (ICD-300.4) Assessment  Improved Continue Zoloft at current dose. Time spent with patient 25 minutes, more than 50% of this time was spent counseling patient on depression. I feel strongly that she should return to work since her symptoms are improved, but she would like for me to extend her short term disability.  We agreed to work up her fatigue and readress in a few weeks but at this time, I do not feel she qualifies for disability.  Problem # 2:  FATIGUE (ICD-780.79) Assessment: New Will check labs to r/o reversible causes. Orders: Venipuncture ([address]644) TLB-BMP (Basic Metabolic Panel-BMET) (80048-METABOL) TLB-CBC Platelet - w/Differential (85025-CBCD) TLB-TSH (Thyroid Stimulating Hormone) (84443-TSH) T-Vitamin D (25-Hydroxy) (03474-25956)  Problem # 3:  HEADACHE, MIXED (ICD-784.0) Assessment: Deteriorated Likely multifactorial- tension, side effects from withdrawl from antidepressants, migraines.  She agreed to go to [facility] center. Her updated medication list for this problem includes:    Ibuprofen 200 Mg Tabs (Ibuprofen) ..... Otc as directed.  Complete Medication List: 1)  Zoloft 50 Mg Tabs (Sertraline hcl) .... Take 1 tablet by mouth once a day 2)  Loestrin 24 Fe 1-20 Mg-mcg Tabs (Norethin ace-eth estrad-fe) .... As directed 3)  Ibuprofen 200 Mg Tabs (Ibuprofen) .... Otc as directed.  Current Allergies (reviewed today): ! PCN

## 2011-01-14 NOTE — Letter (Signed)
[facility]   Imported By: Maryln Gottron 09/30/2010 15:56:15  _____________________________________________________________________  External Attachment:    Type:   Image     Comment:   External Document

## 2011-01-14 NOTE — Progress Notes (Signed)
Summary: Rx Vicodin  Phone Note Refill Request Call back at Home Phone [phone] Message from:  Patient on July 16, 2010 2:00 PM  Refills Requested: Medication #1:  VICODIN 5-500 MG TABS 1 tab every 6 hours as needed for pain [facility] [address].   Initial call taken by: Melody Comas,  July 16, 2010 2:00 PM  Follow-up for Phone Call        Rx called to pharmacy, patient notified. Follow-up by: Linde Gillis CMA Duncan Dull),  July 16, 2010 2:24 PM    Prescriptions: VICODIN 5-500 MG TABS (HYDROCODONE-ACETAMINOPHEN) 1 tab every 6 hours as needed for pain  #30 x 0   Entered and Authorized by:   Ruthe Mannan MD   Signed by:   Ruthe Mannan MD on 07/16/2010   Method used:   Telephoned to ...       [facility]  [address] (retail)       [address]       [place], [place]  [address]       Ph: [phone]       Fax: [phone]   RxID:   [id number]

## 2011-01-20 NOTE — Progress Notes (Signed)
Summary: FMLA paper work  Engineer, water Call   Call placed by: Linde Gillis CMA Duncan Dull),  January 08, 2011 10:13 AM Call placed to: Patient Summary of Call: Dr. Dayton Martes received another FMLA form from patient and she states that she will not fill out this form.  She just filled out the same form at the patients last office visit on 12/28/2010.  I called patient and left a messge on her cell phone voicemail advising her to return the call. Initial call taken by: Linde Gillis CMA Duncan Dull),  January 08, 2011 10:14 AM  Follow-up for Phone Call        Left a message on cell phone voicemail advising patient as instructed. Follow-up by: Linde Gillis CMA Duncan Dull),  January 11, 2011 9:55 AM     Appended Document: FMLA paper work Transport planner paper work mailed back to patient at home address.

## 2011-02-03 NOTE — Letter (Signed)
Summary: [employer] request   [employer] request   Imported By: Kassie Mends 01/26/2011 10:21:54  _____________________________________________________________________  External Attachment:    Type:   Image     Comment:   External Document

## 2011-09-16 ENCOUNTER — Encounter: Payer: Self-pay | Admitting: Family Medicine

## 2011-09-16 ENCOUNTER — Ambulatory Visit (INDEPENDENT_AMBULATORY_CARE_PROVIDER_SITE_OTHER): Payer: Self-pay | Admitting: Family Medicine

## 2011-09-16 VITALS — BP 114/74 | HR 76 | Temp 98.3°F | Wt 233.2 lb

## 2011-09-16 DIAGNOSIS — J029 Acute pharyngitis, unspecified: Secondary | ICD-10-CM

## 2011-09-16 MED ORDER — ERYTHROMYCIN BASE 333 MG PO TBEC: 333.0000 mg | DELAYED_RELEASE_TABLET | Freq: Three times a day (TID) | ORAL | Status: AC

## 2011-09-16 NOTE — Patient Instructions (Signed)
Take Emycin. Be careful since also on Thorazine. Take Guaifenesin (400mg ), take 11/2 tabs by mouth AM and NOON. Get GUAIFENESIN by  going to [facility] or [facility] and getting MUCOUS RELIEF EXPECTORANT/CONGESTION. DO NOT GET MUCINEX (Timed Release Guaifenesin)  Gargle with 30ccs of warm salt water every half hour for 2 days as able. Tylenol 500mg  1-2 tabs by mouth every 8 hrs as needed.

## 2011-09-16 NOTE — Assessment & Plan Note (Addendum)
Alll signs, symptoms and exam lead to high index of suspicion of strep. Due to lack of insurance, will not do RST or culture. Will therefore cover with Emycin. See instructions.

## 2011-09-16 NOTE — Progress Notes (Signed)
  Subjective:    Patient ID: Hannah Stephenson, female    DOB: 08/18/1976, 35 y.o.   MRN: [id number]  HPI Pt of Dr Elmer Sow here as acute appt for sore throat without insurance. She has slight headache, low grade fever , very sore swollen tonsils and entire pharyngeal area is erythematous with exudate visible to her and swelling of the posterior pharyngeal area. She also has fatigue. These sxs just started yesterday. She has had minor chills, yest 100+ and has taken cold and flu medication. No ear pain, minimal headache on top, no rhinitis, on cough, no N/V, diarrhea.     Review of SystemsNoncontributory except as above.       Objective:   Physical Exam  Constitutional: She appears well-developed and well-nourished. No distress.       Nontoxic.  HENT:  Head: Normocephalic and atraumatic.  Right Ear: External ear normal.  Left Ear: External ear normal.  Nose: Nose normal.  Mouth/Throat: No oropharyngeal exudate.       Pharyngeal area erythematous with mild exudate bilat.  Eyes: Conjunctivae and EOM are normal. Pupils are equal, round, and reactive to light.  Neck: Normal range of motion. Neck supple. No thyromegaly present.  Cardiovascular: Normal rate, regular rhythm and normal heart sounds.   Pulmonary/Chest: Effort normal and breath sounds normal. She has no wheezes. She has no rales.  Lymphadenopathy:    She has cervical adenopathy (anterior bilat, L>R.).  Skin: She is not diaphoretic.          Assessment & Plan:

## 2012-01-13 ENCOUNTER — Ambulatory Visit (INDEPENDENT_AMBULATORY_CARE_PROVIDER_SITE_OTHER): Payer: Self-pay | Admitting: Family Medicine

## 2012-01-13 ENCOUNTER — Encounter: Payer: Self-pay | Admitting: Family Medicine

## 2012-01-13 VITALS — BP 112/72 | HR 73 | Temp 98.0°F | Wt 225.0 lb

## 2012-01-13 DIAGNOSIS — F341 Dysthymic disorder: Secondary | ICD-10-CM

## 2012-01-13 MED ORDER — SERTRALINE HCL 100 MG PO TABS: ORAL_TABLET | ORAL | Status: DC

## 2012-01-13 NOTE — Patient Instructions (Signed)
Hang in there. Please call me in one month to let me know how you're doing.

## 2012-01-13 NOTE — Progress Notes (Signed)
  Subjective:    Patient ID: Hannah Stephenson, female    DOB: [birth year], 36 y.o.   MRN: [id number]  HPI  36 yo here to discuss worsening anxiety and depression.  Does not have insurance so stopped taking her Zoloft one year ago.  Father died of mesothelioma in 2023-09-13. Since then, she is very tearful and anxious.  Has good support system. Going to massage therapy school, loves it.  No SI or HI but thinks she needs to restart her medication.  Patient Active Problem List  Diagnoses  . MONONUCLEOSIS  . ANXIETY DEPRESSION  . MIGRAINE VARIANT  . SINUSITIS - ACUTE-NOS  . IRREGULAR MENSTRUAL CYCLE  . BACK PAIN  . SCOLIOSIS  . FATIGUE  . ELEVATED BP READING WITHOUT DX HYPERTENSION  . PANCREATITIS, HX OF  . SOCIAL PHOBIA  . Pharyngitis, acute   Past Medical History  Diagnosis Date  . History of pancreatitis   . Scoliosis     mild  . OCD (obsessive compulsive disorder)   . Wrist fracture, left    Past Surgical History  Procedure Date  . Pilonidal cyst excision    History  Substance Use Topics  . Smoking status: Never Smoker   . Smokeless tobacco: Never Used  . Alcohol Use: No   Family History  Problem Relation Age of Onset  . Depression Mother   . Depression Maternal Grandmother    Allergies  Allergen Reactions  . Penicillins     REACTION: severe ha   Current Outpatient Prescriptions on File Prior to Visit  Medication Sig Dispense Refill  . baclofen (LIORESAL) 10 MG tablet Take 1/2 to 1 by mouth three times a day as needed       . chlorproMAZINE (THORAZINE) 25 MG tablet Take 1-2 by mouth as needed. Repeat if needed in 2 hours       . topiramate (TOPAMAX) 25 MG tablet Take 4 tablets by mouth at bedtime        The PMH, PSH, Social History, Family History, Medications, and allergies have been reviewed in [facility], and have been updated if relevant.    Review of Systems    See HPI Objective:   Physical Exam BP 112/72  Pulse 73  Temp(Src) 98 F (36.7 C)  (Oral)  Wt 225 lb (102.059 kg) Gen:  Alert, tearful Psych: tearful, good eye contact, appropriate     Assessment & Plan:   1. ANXIETY DEPRESSION    >15 min spent with face to face with patient, >50% counseling and/or coordinating care. Cannot afford psychotherapy at this time. Will restart Zoloft. The patient indicates understanding of these issues and agrees with the plan.

## 2012-06-22 ENCOUNTER — Ambulatory Visit (INDEPENDENT_AMBULATORY_CARE_PROVIDER_SITE_OTHER): Payer: Self-pay | Admitting: Family Medicine

## 2012-06-22 ENCOUNTER — Encounter: Payer: Self-pay | Admitting: Family Medicine

## 2012-06-22 VITALS — BP 102/82 | HR 76 | Temp 98.0°F | Wt 225.0 lb

## 2012-06-22 DIAGNOSIS — F341 Dysthymic disorder: Secondary | ICD-10-CM

## 2012-06-22 MED ORDER — ALPRAZOLAM 0.25 MG PO TABS: 0.2500 mg | ORAL_TABLET | Freq: Three times a day (TID) | ORAL | Status: AC | PRN

## 2012-06-22 NOTE — Patient Instructions (Addendum)
Good to see you. Please call me in a few weeks and let me know how you're feeling. Hang in there.

## 2012-06-22 NOTE — Progress Notes (Signed)
  Subjective:    Patient ID: Hannah Stephenson, female    DOB: Feb 03, 1976, 36 y.o.   MRN: [id number]  HPI  36 yo here to discuss worsening anxiety.  Depression has improved on Zoloft.    Has good support system. Going to massage therapy school, loves it. Now that she has extra responsibilities, she often feels panicky. Also does not like being in large crowds.  No SI or HI. Sometimes has difficulty falling asleep. Appetite ok.  Patient Active Problem List  Diagnosis  . MONONUCLEOSIS  . ANXIETY DEPRESSION  . MIGRAINE VARIANT  . SINUSITIS - ACUTE-NOS  . IRREGULAR MENSTRUAL CYCLE  . BACK PAIN  . SCOLIOSIS  . FATIGUE  . ELEVATED BP READING WITHOUT DX HYPERTENSION  . PANCREATITIS, HX OF  . SOCIAL PHOBIA  . Pharyngitis, acute   Past Medical History  Diagnosis Date  . History of pancreatitis   . Scoliosis     mild  . OCD (obsessive compulsive disorder)   . Wrist fracture, left    Past Surgical History  Procedure Date  . Pilonidal cyst excision    History  Substance Use Topics  . Smoking status: Never Smoker   . Smokeless tobacco: Never Used  . Alcohol Use: No   Family History  Problem Relation Age of Onset  . Depression Mother   . Depression Maternal Grandmother    Allergies  Allergen Reactions  . Penicillins     REACTION: severe ha   Current Outpatient Prescriptions on File Prior to Visit  Medication Sig Dispense Refill  . baclofen (LIORESAL) 10 MG tablet Take 1/2 to 1 by mouth three times a day as needed       . chlorproMAZINE (THORAZINE) 25 MG tablet Take 1-2 by mouth as needed. Repeat if needed in 2 hours       . sertraline (ZOLOFT) 100 MG tablet Take 1 and 1/2 by mouth daily  60 tablet  6  . topiramate (TOPAMAX) 25 MG tablet Take 4 tablets by mouth at bedtime        The PMH, PSH, Social History, Family History, Medications, and allergies have been reviewed in [facility], and have been updated if relevant.    Review of Systems    See HPI Objective:   Physical Exam BP 102/82  Pulse 76  Temp 98 F (36.7 C)  Wt 225 lb (102.059 kg) Gen:  Alert, tearful Psych: tearful, good eye contact, appropriate     Assessment & Plan:   1. ANXIETY DEPRESSION    >15 min spent with face to face with patient, >50% counseling and/or coordinating care. Cannot afford psychotherapy at this time. Continue Zoloft. Start Xanax prn- discussed sedation and addiction potential. The patient indicates understanding of these issues and agrees with the plan.

## 2012-08-22 ENCOUNTER — Telehealth: Payer: Self-pay

## 2012-08-22 NOTE — Telephone Encounter (Signed)
Pt said alprazolam 0.25 mg helps anxiety when has panic attack, but calmness does not last long (may last 1- 1 1/2 hrs). Please advise.  Wants pain for menstrual cramps for first couple of days of menstrual cycle ( pt has to stay in bed ibuprofen does not help pain). Can med be sent to [facility] [address] or does pt need to come in for appt.

## 2012-08-22 NOTE — Telephone Encounter (Signed)
Pt needs to come in for appt

## 2012-08-23 NOTE — Telephone Encounter (Signed)
Advised patient, appt scheduled.  Should this be 30 mins or is 15 minute slot ok?

## 2012-08-23 NOTE — Telephone Encounter (Signed)
30 minutes please. Thank you.

## 2012-08-25 ENCOUNTER — Encounter: Payer: Self-pay | Admitting: Family Medicine

## 2012-08-25 ENCOUNTER — Ambulatory Visit (INDEPENDENT_AMBULATORY_CARE_PROVIDER_SITE_OTHER): Payer: Self-pay | Admitting: Family Medicine

## 2012-08-25 VITALS — BP 120/90 | HR 84 | Temp 98.6°F | Wt 229.0 lb

## 2012-08-25 DIAGNOSIS — N946 Dysmenorrhea, unspecified: Secondary | ICD-10-CM | POA: Insufficient documentation

## 2012-08-25 DIAGNOSIS — F341 Dysthymic disorder: Secondary | ICD-10-CM

## 2012-08-25 MED ORDER — PAROXETINE HCL 20 MG PO TABS: ORAL_TABLET | ORAL | Status: DC

## 2012-08-25 NOTE — Progress Notes (Signed)
  Subjective:    Patient ID: Hannah Stephenson, female    DOB: 1975/12/28, 36 y.o.   MRN: [id number]  HPI  36 yo here for 2 month follow up of worsening anxiety.  Saw her in July, she felt her anxiety had worsened but depression had greatly improved on Zoloft.  Has good support system. Going to massage therapy school, loves it. Now that she has extra responsibilities, she often feels panicky. Also does not like being in large crowds.  We continued Zoloft and added as needed xanax for panic attacks. Since then, she states that xanax helps but wears off after an hour or so.  Very anxious, at times, she cannot leave her house.  Has tried Prozac and Effexor and could not tolerate them.  Patient Active Problem List  Diagnosis  . MONONUCLEOSIS  . ANXIETY DEPRESSION  . MIGRAINE VARIANT  . SINUSITIS - ACUTE-NOS  . IRREGULAR MENSTRUAL CYCLE  . BACK PAIN  . SCOLIOSIS  . FATIGUE  . ELEVATED BP READING WITHOUT DX HYPERTENSION  . PANCREATITIS, HX OF  . SOCIAL PHOBIA  . Pharyngitis, acute   Past Medical History  Diagnosis Date  . History of pancreatitis   . Scoliosis     mild  . OCD (obsessive compulsive disorder)   . Wrist fracture, left    Past Surgical History  Procedure Date  . Pilonidal cyst excision    History  Substance Use Topics  . Smoking status: Never Smoker   . Smokeless tobacco: Never Used  . Alcohol Use: No   Family History  Problem Relation Age of Onset  . Depression Mother   . Depression Maternal Grandmother    Allergies  Allergen Reactions  . Penicillins     REACTION: severe ha   Current Outpatient Prescriptions on File Prior to Visit  Medication Sig Dispense Refill  . baclofen (LIORESAL) 10 MG tablet Take 1/2 to 1 by mouth three times a day as needed       . chlorproMAZINE (THORAZINE) 25 MG tablet Take 1-2 by mouth as needed. Repeat if needed in 2 hours       . sertraline (ZOLOFT) 100 MG tablet Take 1 and 1/2 by mouth daily  60 tablet  6  .  topiramate (TOPAMAX) 25 MG tablet Take 4 tablets by mouth at bedtime        The PMH, PSH, Social History, Family History, Medications, and allergies have been reviewed in [facility], and have been updated if relevant.    Review of Systems    See HPI Objective:   Physical Exam Wt 229 lb (103.874 kg) BP 120/90  Pulse 84  Temp 98.6 F (37 C)  Wt 229 lb (103.874 kg)  Gen:  Alert, tearful Psych: tearful, good eye contact, appropriate     Assessment & Plan:   1. ANXIETY DEPRESSION    >25 min spent with face to face with patient, >50% counseling and/or coordinating care. Will wean off of Zoloft, start Paxil. She does not have insurance- declining psychotherapy. Call in 2 weeks with an update. The patient indicates understanding of these issues and agrees with the plan.

## 2012-08-25 NOTE — Patient Instructions (Addendum)
Let's wean off Zoloft: Take 1 tablet every other day for 1 week then stop. We are starting Paxil- 20 mg daily (ok to cut in half for first few days). Call me in 2 weeks, we may need to increase to 40 mg.

## 2012-09-06 ENCOUNTER — Telehealth: Payer: Self-pay

## 2012-09-06 MED ORDER — NORETHINDRONE ACET-ETHINYL EST 1.5-30 MG-MCG PO TABS: 1.0000 | ORAL_TABLET | Freq: Every day | ORAL | Status: DC

## 2012-09-06 NOTE — Telephone Encounter (Signed)
Pt states Paxil is working, no panic attacks,anxiety much better; pt feels better in general.Pt also wants birth control sent to [facility] [address](discussed with Dr Dayton Martes last visit).Please advise.

## 2012-09-06 NOTE — Telephone Encounter (Signed)
That's great! Thanks for the update. OCP sent to [facility].

## 2012-09-06 NOTE — Telephone Encounter (Signed)
Advised patient

## 2012-10-02 ENCOUNTER — Telehealth: Payer: Self-pay | Admitting: *Deleted

## 2012-10-02 DIAGNOSIS — F32A Depression, unspecified: Secondary | ICD-10-CM

## 2012-10-02 DIAGNOSIS — F329 Major depressive disorder, single episode, unspecified: Secondary | ICD-10-CM

## 2012-10-02 NOTE — Telephone Encounter (Signed)
Referral placed.

## 2012-10-02 NOTE — Telephone Encounter (Signed)
Perhaps at this point, we really need to refer to psychiatry for more input on medications.  Please let me know and I will place referral.

## 2012-10-02 NOTE — Telephone Encounter (Signed)
Advised patient, she is agreeable to seeing psych.  Advised pt Hannah Stephenson will call her with names and numbers of doctors patient can call, since we are unable to schedule psyche appts, per Hannah Stephenson.

## 2012-10-02 NOTE — Telephone Encounter (Signed)
Pt calls stating she started Paxil one month ago and the depression symptoms are starting again, she has been crying a lot.

## 2013-06-11 ENCOUNTER — Ambulatory Visit (INDEPENDENT_AMBULATORY_CARE_PROVIDER_SITE_OTHER): Payer: Self-pay | Admitting: Family Medicine

## 2013-06-11 ENCOUNTER — Encounter: Payer: Self-pay | Admitting: Family Medicine

## 2013-06-11 ENCOUNTER — Encounter: Payer: Self-pay | Admitting: Radiology

## 2013-06-11 VITALS — BP 110/88 | HR 72 | Temp 98.1°F | Wt 229.0 lb

## 2013-06-11 DIAGNOSIS — F341 Dysthymic disorder: Secondary | ICD-10-CM

## 2013-06-11 MED ORDER — SERTRALINE HCL 100 MG PO TABS: ORAL_TABLET | ORAL | Status: DC

## 2013-06-11 MED ORDER — ALPRAZOLAM 0.25 MG PO TABS: ORAL_TABLET | ORAL | Status: DC

## 2013-06-11 NOTE — Patient Instructions (Addendum)
Good to see you. Hang in there. We are restarting the Zoloft. If you do have suicidal thoughts, please go straight to the ER.  Keep me posted.

## 2013-06-11 NOTE — Progress Notes (Signed)
Subjective:    Patient ID: Hannah Stephenson, female    DOB: 04/10/1976, 37 y.o.   MRN: [id number]  HPI  37 yo here for follow up of worsening anxiety/depression.  Did not follow up as suggested after her 08/2012 appt.    Saw her in July 2013, she felt her anxiety had worsened but depression had greatly improved on Zoloft.  Has good support system.  We continued Zoloft and added as needed xanax for panic attacks. In 08/2012, symptoms had deteriorated   Very anxious, at times, felt she could not leave her house.    Has tried Prozac and Effexor and could not tolerate them.  We started Paxil and she again declined psychotherapy. She stopped taking Paxil which helped with anxiety but not with depression.  Now very tearful and "hopeless."  Has had suicidal thoughts, but not having any now. She would never commit suicide because of her daughter.  Cannot find a job which is worsening her mood.  Sleeping ok.  Appetite good.  Weight stable.  She wants to restart Zoloft instead of trying another agent. States she can "handle the anxiety but not the depression." Wt Readings from Last 3 Encounters:  06/11/13 229 lb (103.874 kg)  08/25/12 229 lb (103.874 kg)  06/22/12 225 lb (102.059 kg)     Patient Active Problem List   Diagnosis Date Noted  . Menstrual cramps 08/25/2012  . Pharyngitis, acute 09/16/2011  . SOCIAL PHOBIA 12/28/2010  . ELEVATED BP READING WITHOUT DX HYPERTENSION 08/20/2010  . SINUSITIS - ACUTE-NOS 07/21/2010  . BACK PAIN 03/03/2010  . IRREGULAR MENSTRUAL CYCLE 02/25/2010  . MIGRAINE VARIANT 12/29/2009  . FATIGUE 12/25/2009  . MONONUCLEOSIS 10/24/2009  . ANXIETY DEPRESSION 10/24/2009  . SCOLIOSIS 10/24/2009  . PANCREATITIS, HX OF 10/24/2009   Past Medical History  Diagnosis Date  . History of pancreatitis   . Scoliosis     mild  . OCD (obsessive compulsive disorder)   . Wrist fracture, left    Past Surgical History  Procedure Laterality Date  . Pilonidal  cyst excision     History  Substance Use Topics  . Smoking status: Never Smoker   . Smokeless tobacco: Never Used  . Alcohol Use: No   Family History  Problem Relation Age of Onset  . Depression Mother   . Depression Maternal Grandmother    Allergies  Allergen Reactions  . Penicillins     REACTION: severe ha   Current Outpatient Prescriptions on File Prior to Visit  Medication Sig Dispense Refill  . ALPRAZolam (XANAX) 0.25 MG tablet Take one by mouth three times daily as needed      . baclofen (LIORESAL) 10 MG tablet Take 1/2 to 1 by mouth three times a day as needed       . chlorproMAZINE (THORAZINE) 25 MG tablet Take 1-2 by mouth as needed. Repeat if needed in 2 hours       . Norethindrone Acetate-Ethinyl Estradiol (JUNEL,LOESTRIN,MICROGESTIN) 1.5-30 MG-MCG tablet Take 1 tablet by mouth daily.  1 Package  11  . PARoxetine (PAXIL) 20 MG tablet 1 tab by mouth daily- for first 3 days ok to cut tablet in half and take 1/2 tablet by mouth daily.  30 tablet  1  . topiramate (TOPAMAX) 25 MG tablet Take 4 tablets by mouth at bedtime        No current facility-administered medications on file prior to visit.   The PMH, PSH, Social History, Family History, Medications, and allergies have been  reviewed in [facility], and have been updated if relevant.    Review of Systems    See HPI Objective:   Physical Exam BP 110/88  Pulse 72  Temp(Src) 98.1 F (36.7 C)  Wt 229 lb (103.874 kg)  BMI 41.19 kg/m2  Gen:  Alert, tearful Psych: tearful, good eye contact, appropriate     Assessment & Plan:   ANXIETY DEPRESSION  >25 min spent with face to face with patient, >50% counseling and/or coordinating care. Again declines psychotherapy.  No current SI.   Will restart Zoloft per pt request.  Follow up in 3 weeks.

## 2013-06-11 NOTE — Addendum Note (Signed)
Addended by: Dianne Dun on: 06/11/2013 10:32 AM   Modules accepted: Orders

## 2013-06-21 ENCOUNTER — Encounter: Payer: Self-pay | Admitting: Family Medicine

## 2013-06-21 ENCOUNTER — Other Ambulatory Visit: Payer: Self-pay | Admitting: Family Medicine

## 2013-06-21 ENCOUNTER — Ambulatory Visit (INDEPENDENT_AMBULATORY_CARE_PROVIDER_SITE_OTHER): Payer: Self-pay | Admitting: Family Medicine

## 2013-06-21 VITALS — BP 120/84 | HR 60 | Temp 98.1°F | Wt 227.0 lb

## 2013-06-21 DIAGNOSIS — R109 Unspecified abdominal pain: Secondary | ICD-10-CM

## 2013-06-21 DIAGNOSIS — R300 Dysuria: Secondary | ICD-10-CM

## 2013-06-21 DIAGNOSIS — R102 Pelvic and perineal pain: Secondary | ICD-10-CM | POA: Insufficient documentation

## 2013-06-21 DIAGNOSIS — N898 Other specified noninflammatory disorders of vagina: Secondary | ICD-10-CM

## 2013-06-21 DIAGNOSIS — R1030 Lower abdominal pain, unspecified: Secondary | ICD-10-CM

## 2013-06-21 LAB — POCT URINALYSIS DIPSTICK
Bilirubin, UA: NEGATIVE
Glucose, UA: NEGATIVE
Ketones, UA: NEGATIVE
Nitrite, UA: NEGATIVE
Spec Grav, UA: 1.01
pH, UA: 7.5

## 2013-06-21 MED ORDER — CIPROFLOXACIN HCL 500 MG PO TABS: 500.0000 mg | ORAL_TABLET | Freq: Two times a day (BID) | ORAL | Status: DC

## 2013-06-21 NOTE — Assessment & Plan Note (Signed)
With some discomfort with pelvic exam, but no obvious cervicitis. Wet prep in office overall normal, have sent wet prep to lab. Await results prior to treating for candidal vaginitis.

## 2013-06-21 NOTE — Patient Instructions (Signed)
I do think you have urinary infection - culture sent today.  Treat with 5 day course of ciprofloxacin twice daily Wet prep overall looking clear - am awaiting yeast test. If positive, will send yeast medication to treat. Push fluids and plenty of rest. Good to see you today, call us with questions.

## 2013-06-21 NOTE — Progress Notes (Signed)
  Subjective:    Patient ID: Hannah Stephenson, female    DOB: August 21, 1976, 37 y.o.   MRN: [id number]  HPI CC: yeast and bladder infection?  6-7 wk h/o suprapubic cramping discomfort with urination, possibly incomplete emptying.  Also with nausea over last 2 weeks, no vomiting. Has tried hot shower/heating pad.  Tries to avoid advil/aspirin.  H/o UTIs in past.  UTI sxs slowly resolved on their own.  Then started noticing white vaginal discharge, not more malodorous than usual.  This started 3-4 wks ago associated with significant itching and heat rash - self treated with baby powder. Has tried OTC vagisil anti itch cream.  No fevers/chills, back pain, flank pain, vomiting.  No frequency or hematuria or urgency.  Not drinking as much as she should.  Last 2 months, decreased duration of periods.  H/o severe pain with periods - suspected due to ovarian cysts. LMP - 06/14/2013. Monogamous relationship for last 4 years.  H/o miscarriage 4 yrs ago.  Past Medical History  Diagnosis Date  . History of pancreatitis   . Scoliosis     mild  . OCD (obsessive compulsive disorder)   . Wrist fracture, left      Review of Systems Per HPI    Objective:   Physical Exam  Nursing note and vitals reviewed. Constitutional: She appears well-developed and well-nourished. No distress.  Abdominal: Soft. Normal appearance and bowel sounds are normal. She exhibits no distension and no mass. There is no hepatosplenomegaly. There is tenderness in the right lower quadrant, suprapubic area and left lower quadrant. There is no rigidity, no rebound, no guarding, no CVA tenderness and negative Murphy's sign.  Genitourinary: Vagina normal and uterus normal. Pelvic exam was performed with patient supine. There is no rash, tenderness, lesion or injury on the right labia. There is no rash, tenderness, lesion or injury on the left labia. Cervix exhibits no motion tenderness, no discharge and no friability. Right adnexum  displays tenderness. Right adnexum displays no mass and no fullness. Left adnexum displays tenderness. Left adnexum displays no mass and no fullness.  Skin: Skin is warm and dry. No rash noted.  No pelvic rash       Assessment & Plan:

## 2013-06-21 NOTE — Assessment & Plan Note (Signed)
Of several week duration Suspicious urinalysis - send culture and treat with 5d course cipro 500mg  bid given duration of sxs. Update if worsening or no improvement as expected.

## 2013-06-22 ENCOUNTER — Other Ambulatory Visit: Payer: Self-pay | Admitting: Family Medicine

## 2013-06-22 ENCOUNTER — Encounter: Payer: Self-pay | Admitting: Family Medicine

## 2013-06-22 LAB — WET PREP BY MOLECULAR PROBE
Gardnerella vaginalis: NEGATIVE
Trichomonas vaginosis: POSITIVE — AB

## 2013-06-22 MED ORDER — METRONIDAZOLE 375 MG PO CAPS: 375.0000 mg | ORAL_CAPSULE | Freq: Two times a day (BID) | ORAL | Status: DC

## 2013-06-23 LAB — URINE CULTURE: Organism ID, Bacteria: NO GROWTH

## 2013-10-18 ENCOUNTER — Other Ambulatory Visit: Payer: Self-pay

## 2013-11-09 ENCOUNTER — Ambulatory Visit (INDEPENDENT_AMBULATORY_CARE_PROVIDER_SITE_OTHER): Payer: Self-pay | Admitting: Family Medicine

## 2013-11-09 ENCOUNTER — Encounter: Payer: Self-pay | Admitting: Family Medicine

## 2013-11-09 VITALS — BP 116/86 | HR 81 | Temp 98.1°F | Ht 63.5 in | Wt 222.0 lb

## 2013-11-09 DIAGNOSIS — J0100 Acute maxillary sinusitis, unspecified: Secondary | ICD-10-CM

## 2013-11-09 MED ORDER — AZITHROMYCIN 250 MG PO TABS: ORAL_TABLET | ORAL | Status: DC

## 2013-11-09 NOTE — Progress Notes (Signed)
Pre-visit discussion using our clinic review tool. No additional management support is needed unless otherwise documented below in the visit note.  

## 2013-11-09 NOTE — Progress Notes (Signed)
Date:  11/09/2013   Name:  Hannah Stephenson   DOB:  1976-08-29   MRN:  [id number] Gender: female Age: 37 y.o.  Primary Physician:  Ruthe Mannan, MD   Chief Complaint: Sinusitis   Subjective:   History of Present Illness:  Hannah Stephenson is a 2 y.o. very pleasant female patient who presents with the following:  Has a lot of pressure in her face, head hurts, sore throat for several days. Hacking cough and it will get worse. Off and on fever. Covered in sweats. Hot one minute. The longer up and about the worse she feels. She is a lot of pain in the maxillary sinus region. She has been sick now for more than a week.  Nonsmoker    Past Medical History, Surgical History, Social History, Family History, Problem List, Medications, and Allergies have been reviewed and updated if relevant.  Review of Systems: ROS: GEN: Acute illness details above GI: Tolerating PO intake GU: maintaining adequate hydration and urination Pulm: No SOB Interactive and getting along well at home.  Otherwise, ROS is as per the HPI.   Objective:   Physical Examination: BP 116/86  Pulse 81  Temp(Src) 98.1 F (36.7 C) (Oral)  Ht 5' 3.5" (1.613 m)  Wt 222 lb (100.699 kg)  BMI 38.70 kg/m2  LMP 10/31/2013   Gen: WDWN, NAD; alert,appropriate and cooperative throughout exam  HEENT: Normocephalic and atraumatic. Throat clear, w/o exudate, no LAD, R TM clear, L TM - good landmarks, No fluid present. rhinnorhea.  Left frontal and maxillary sinuses: Tender, max Right frontal and maxillary sinuses: Tender, max  Neck: No ant or post LAD CV: RRR, No M/G/R Pulm: Breathing comfortably in no resp distress. no w/c/r Abd: S,NT,ND,+BS Extr: no c/c/e Psych: full affect, pleasant    Laboratory and Imaging Data:  Assessment & Plan:    Maxillary sinusitis, acute  Acute sinusitis: ABX as below.   Reviewed symptomatic care as well as ABX in this case.   New medications, updates to list, dose  adjustments: Meds ordered this encounter  Medications  . azithromycin (ZITHROMAX Z-PAK) 250 MG tablet    Sig: Take 2 tablets (500 mg) on  Day 1,  followed by 1 tablet (250 mg) once daily on Days 2 through 5.    Dispense:  6 each    Refill:  0    Signed,  Zymeir Salminen T. Lynette Noah, MD, CAQ Sports Medicine  [facility] [address] [place] [address] Phone: [phone] Fax: [phone]  Updated Complete Medication List:   Medication List       This list is accurate as of: 11/09/13  4:39 PM.  Always use your most recent med list.               ALPRAZolam 0.25 MG tablet  Commonly known as:  XANAX  Take one by mouth three times daily as needed Take one by mouth three times daily as needed     azithromycin 250 MG tablet  Commonly known as:  ZITHROMAX Z-PAK  Take 2 tablets (500 mg) on  Day 1,  followed by 1 tablet (250 mg) once daily on Days 2 through 5.     baclofen 10 MG tablet  Commonly known as:  LIORESAL  Take 1/2 to 1 by mouth three times a day as needed     sertraline 100 MG tablet  Commonly known as:  ZOLOFT  Take 1 and 1/2 by mouth daily  TOPAMAX 25 MG tablet  Generic drug:  topiramate  Take 4 tablets by mouth at bedtime

## 2013-12-11 ENCOUNTER — Other Ambulatory Visit: Payer: Self-pay | Admitting: Family Medicine

## 2013-12-11 MED ORDER — ALPRAZOLAM 0.25 MG PO TABS: ORAL_TABLET | ORAL | Status: DC

## 2013-12-12 ENCOUNTER — Other Ambulatory Visit: Payer: Self-pay | Admitting: Family Medicine

## 2013-12-12 NOTE — Telephone Encounter (Signed)
Rx called in to pharmacy. 

## 2013-12-12 NOTE — Telephone Encounter (Signed)
Spoke to pt and informed her that Rx is available at the front desk for pick up. Informed that gov't issued photo id is needed.

## 2014-02-25 ENCOUNTER — Encounter: Payer: Self-pay | Admitting: Internal Medicine

## 2014-02-25 ENCOUNTER — Ambulatory Visit (INDEPENDENT_AMBULATORY_CARE_PROVIDER_SITE_OTHER): Payer: Self-pay | Admitting: Internal Medicine

## 2014-02-25 VITALS — BP 128/82 | HR 77 | Temp 99.0°F | Wt 217.5 lb

## 2014-02-25 DIAGNOSIS — R197 Diarrhea, unspecified: Secondary | ICD-10-CM

## 2014-02-25 MED ORDER — DIPHENOXYLATE-ATROPINE 2.5-0.025 MG PO TABS: 1.0000 | ORAL_TABLET | Freq: Four times a day (QID) | ORAL | Status: DC | PRN

## 2014-02-25 NOTE — Progress Notes (Signed)
Subjective:    Patient ID: Hannah Stephenson, female    DOB: [birth year], 38 y.o.   MRN: [id number]  HPI  Pt presents to the clinic today with c/o diarrhea, nasuea and abdominal cramping. She reports this started 9 days ago. This is constant. She has had trouble eating but is drinking fluids. She feels like she has excess gas and bloating. She has no history of GERD. She does have a family history of IBS. She denies blood in her stool. She has not started or stopped any new medications. She has not been on abx recently.  Review of Systems      Past Medical History  Diagnosis Date  . History of pancreatitis   . Scoliosis     mild  . OCD (obsessive compulsive disorder)   . Wrist fracture, left     Current Outpatient Prescriptions  Medication Sig Dispense Refill  . ALPRAZolam (XANAX) 0.25 MG tablet Take one by mouth three times daily as needed Take one by mouth three times daily as needed  30 tablet  0  . sertraline (ZOLOFT) 100 MG tablet Take 1 and 1/2 by mouth daily  60 tablet  6  . topiramate (TOPAMAX) 25 MG tablet Take 4 tablets by mouth at bedtime        No current facility-administered medications for this visit.    Allergies  Allergen Reactions  . Penicillins     REACTION: severe ha    Family History  Problem Relation Age of Onset  . Depression Mother   . Depression Maternal Grandmother     History   Social History  . Marital Status: Married    Spouse Name: N/A    Number of Children: 1  . Years of Education: N/A   Occupational History  . [employer]     Customer Service   Social History Main Topics  . Smoking status: Never Smoker   . Smokeless tobacco: Never Used  . Alcohol Use: No  . Drug Use: No  . Sexual Activity: Not on file   Other Topics Concern  . Not on file   Social History Narrative   Divorced   One girl     Constitutional: Denies fever, malaise, fatigue, headache or abrupt weight changes.  Gastrointestinal: Pt reports nausea, abdominal  cramping and diarrhea. Denies bloating, constipation, or blood in the stool.     No other specific complaints in a complete review of systems (except as listed in HPI above).  Objective:   Physical Exam   BP 128/82  Pulse 77  Temp(Src) 99 F (37.2 C) (Oral)  Wt 217 lb 8 oz (98.657 kg)  SpO2 98% Wt Readings from Last 3 Encounters:  02/25/14 217 lb 8 oz (98.657 kg)  11/09/13 222 lb (100.699 kg)  06/21/13 227 lb (102.967 kg)    General: Appears her stated age, obese but well developed, well nourished in NAD.  Cardiovascular: Normal rate and rhythm. S1,S2 noted.  No murmur, rubs or gallops noted. No JVD or BLE edema. No carotid bruits noted. Pulmonary/Chest: Normal effort and positive vesicular breath sounds. No respiratory distress. No wheezes, rales or ronchi noted.  Abdomen: Soft and tender in the lower quadrants. Normal bowel sounds, no bruits noted. No distention or masses noted. Liver, spleen and kidneys non palpable.   BMET    Component Value Date/Time   NA 142 08/20/2010 1205   K 4.0 08/20/2010 1205   CL 107 08/20/2010 1205   CO2 25 08/20/2010 1205  GLUCOSE 76 08/20/2010 1205   BUN 18 08/20/2010 1205   CREATININE 0.6 08/20/2010 1205   CALCIUM 8.8 08/20/2010 1205   GFRNONAA 113.42 08/20/2010 1205    Lipid Panel  No results found for this basename: chol, trig, hdl, cholhdl, vldl, ldlcalc    CBC    Component Value Date/Time   WBC 8.7 12/25/2009 1247   RBC 4.18 12/25/2009 1247   HGB 13.4 12/25/2009 1247   HCT 41.1 12/25/2009 1247   PLT 337.0 12/25/2009 1247   MCV 98.2 12/25/2009 1247   MCHC 32.7 12/25/2009 1247   RDW 11.6 12/25/2009 1247   LYMPHSABS 2.7 12/25/2009 1247   MONOABS 0.6 12/25/2009 1247   EOSABS 0.1 12/25/2009 1247   BASOSABS 0.0 12/25/2009 1247    Hgb A1C No results found for this basename: HGBA1C        Assessment & Plan:   Diarrhea:  Drink plenty of fluids to avoid dehydration eRx for Lomotil BID (not more to avoid constipation) Eat a bland diet Work note  provided  RTC if symptoms persist or worsen

## 2014-02-25 NOTE — Progress Notes (Signed)
Pre visit review using our clinic review tool, if applicable. No additional management support is needed unless otherwise documented below in the visit note. 

## 2014-02-25 NOTE — Patient Instructions (Addendum)
Diarrhea Diarrhea is frequent loose and watery bowel movements. It can cause you to feel weak and dehydrated. Dehydration can cause you to become tired and thirsty, have a dry mouth, and have decreased urination that often is dark yellow. Diarrhea is a sign of another problem, most often an infection that will not last long. In most cases, diarrhea typically lasts 2 3 days. However, it can last longer if it is a sign of something more serious. It is important to treat your diarrhea as directed by your caregive to lessen or prevent future episodes of diarrhea. CAUSES  Some common causes include:  Gastrointestinal infections caused by viruses, bacteria, or parasites.  Food poisoning or food allergies.  Certain medicines, such as antibiotics, chemotherapy, and laxatives.  Artificial sweeteners and fructose.  Digestive disorders. HOME CARE INSTRUCTIONS  Ensure adequate fluid intake (hydration): have 1 cup (8 oz) of fluid for each diarrhea episode. Avoid fluids that contain simple sugars or sports drinks, fruit juices, whole milk products, and sodas. Your urine should be clear or pale yellow if you are drinking enough fluids. Hydrate with an oral rehydration solution that you can purchase at pharmacies, retail stores, and online. You can prepare an oral rehydration solution at home by mixing the following ingredients together:    tsp table salt.   tsp baking soda.   tsp salt substitute containing potassium chloride.  1  tablespoons sugar.  1 L (34 oz) of water.  Certain foods and beverages may increase the speed at which food moves through the gastrointestinal (GI) tract. These foods and beverages should be avoided and include:  Caffeinated and alcoholic beverages.  High-fiber foods, such as raw fruits and vegetables, nuts, seeds, and whole grain breads and cereals.  Foods and beverages sweetened with sugar alcohols, such as xylitol, sorbitol, and mannitol.  Some foods may be well  tolerated and may help thicken stool including:  Starchy foods, such as rice, toast, pasta, low-sugar cereal, oatmeal, grits, baked potatoes, crackers, and bagels.  Bananas.  Applesauce.  Add probiotic-rich foods to help increase healthy bacteria in the GI tract, such as yogurt and fermented milk products.  Wash your hands well after each diarrhea episode.  Only take over-the-counter or prescription medicines as directed by your caregiver.  Take a warm bath to relieve any burning or pain from frequent diarrhea episodes. SEEK IMMEDIATE MEDICAL CARE IF:   You are unable to keep fluids down.  You have persistent vomiting.  You have blood in your stool, or your stools are black and tarry.  You do not urinate in 6 8 hours, or there is only a small amount of very dark urine.  You have abdominal pain that increases or localizes.  You have weakness, dizziness, confusion, or lightheadedness.  You have a severe headache.  Your diarrhea gets worse or does not get better.  You have a fever or persistent symptoms for more than 2 3 days.  You have a fever and your symptoms suddenly get worse. MAKE SURE YOU:   Understand these instructions.  Will watch your condition.  Will get help right away if you are not doing well or get worse. Document Released: 11/19/2002 Document Revised: 11/15/2012 Document Reviewed: 08/06/2012 Exit[employer] Patient Information 2014 [employer].  

## 2014-08-06 ENCOUNTER — Encounter: Payer: Self-pay | Admitting: Internal Medicine

## 2014-08-06 ENCOUNTER — Ambulatory Visit (INDEPENDENT_AMBULATORY_CARE_PROVIDER_SITE_OTHER): Payer: Self-pay | Admitting: Internal Medicine

## 2014-08-06 VITALS — BP 124/82 | HR 71 | Temp 98.9°F | Wt 214.0 lb

## 2014-08-06 DIAGNOSIS — B9789 Other viral agents as the cause of diseases classified elsewhere: Secondary | ICD-10-CM

## 2014-08-06 DIAGNOSIS — J329 Chronic sinusitis, unspecified: Secondary | ICD-10-CM

## 2014-08-06 MED ORDER — METHYLPREDNISOLONE ACETATE 80 MG/ML IJ SUSP
80.0000 mg | Freq: Once | INTRAMUSCULAR | Status: AC
  Administered 2014-08-06: 80 mg via INTRAMUSCULAR

## 2014-08-06 NOTE — Patient Instructions (Addendum)

## 2014-08-06 NOTE — Progress Notes (Signed)
HPI  Pt presents to the clinic today with c/o body aches, cough, chest congestion and ear fullness. She reports this started 3 days ago. She has noticed some chest tightness but denies chest pain or shortness of breath. The cough is productive of thick clear mucous. She has had some facial pressure and pressure with sore throat. She has tried OTC sinus medication, dayquil and mucinex without any relief. She denies history of allergies or breathing problems. She has not had sick contacts that she is aware of.  Review of Systems    Past Medical History  Diagnosis Date  . History of pancreatitis   . Scoliosis     mild  . OCD (obsessive compulsive disorder)   . Wrist fracture, left     Family History  Problem Relation Age of Onset  . Depression Mother   . Depression Maternal Grandmother     History   Social History  . Marital Status: Married    Spouse Name: N/A    Number of Children: 1  . Years of Education: N/A   Occupational History  . [employer]     Customer Service   Social History Main Topics  . Smoking status: Never Smoker   . Smokeless tobacco: Never Used  . Alcohol Use: No  . Drug Use: No  . Sexual Activity: Not on file   Other Topics Concern  . Not on file   Social History Narrative   Divorced   One girl    Allergies  Allergen Reactions  . Penicillins     REACTION: severe ha     Constitutional: Positive headache, fatigue . Denies fever or abrupt weight changes.  HEENT:  Positive  facial pain, nasal congestion and sore throat. Denies eye redness, ear pain, ringing in the ears, wax buildup, runny nose or bloody nose. Respiratory: Positive cough. Denies difficulty breathing or shortness of breath.  Cardiovascular: Denies chest pain, chest tightness, palpitations or swelling in the hands or feet.   No other specific complaints in a complete review of systems (except as listed in HPI above).  Objective:  BP 124/82  Pulse 71  Temp(Src) 98.9 F (37.2 C)  (Oral)  Wt 214 lb (97.07 kg)  SpO2 97%   General: Appears her stated age, ill appearing in NAD. HEENT: Head: normal shape and size, sinus tenderness noted; Eyes: sclera white, no icterus, conjunctiva pink; Ears: Tm's gray and intact, normal light reflex; Nose: mucosa pink and moist, septum midline; Throat/Mouth: + PND. Teeth present, mucosa erythematous and moist, no exudate noted, no lesions or ulcerations noted.  Cardiovascular: Normal rate and rhythm. S1,S2 noted.  No murmur, rubs or gallops noted. No JVD or BLE edema. No carotid bruits noted. Pulmonary/Chest: Normal effort and positive vesicular breath sounds. No respiratory distress. No wheezes, rales or ronchi noted.      Assessment & Plan:   Acute viarl sinusitis  Continue the Mucinex q12h Add ibuprofen for body aches  80 mg Depo IM today for chest tightness and sore throat Work not provided to return to work on Thursday  RTC as needed or if symptoms persist.

## 2014-08-06 NOTE — Progress Notes (Signed)
Pre visit review using our clinic review tool, if applicable. No additional management support is needed unless otherwise documented below in the visit note. 

## 2014-08-06 NOTE — Addendum Note (Signed)
Addended by: Lurlean Nanny on: 08/06/2014 11:45 AM   Modules accepted: Orders

## 2015-03-10 ENCOUNTER — Encounter: Payer: Self-pay | Admitting: Family Medicine

## 2015-03-10 ENCOUNTER — Ambulatory Visit (INDEPENDENT_AMBULATORY_CARE_PROVIDER_SITE_OTHER): Payer: Self-pay | Admitting: Family Medicine

## 2015-03-10 VITALS — BP 124/94 | HR 66 | Temp 98.0°F | Wt 205.8 lb

## 2015-03-10 DIAGNOSIS — J019 Acute sinusitis, unspecified: Secondary | ICD-10-CM | POA: Insufficient documentation

## 2015-03-10 DIAGNOSIS — J0100 Acute maxillary sinusitis, unspecified: Secondary | ICD-10-CM

## 2015-03-10 MED ORDER — MECLIZINE HCL 25 MG PO TABS: 25.0000 mg | ORAL_TABLET | Freq: Three times a day (TID) | ORAL | Status: DC | PRN

## 2015-03-10 MED ORDER — DOXYCYCLINE HYCLATE 100 MG PO CAPS: 100.0000 mg | ORAL_CAPSULE | Freq: Two times a day (BID) | ORAL | Status: DC

## 2015-03-10 NOTE — Progress Notes (Signed)
Pre visit review using our clinic review tool, if applicable. No additional management support is needed unless otherwise documented below in the visit note. 

## 2015-03-10 NOTE — Assessment & Plan Note (Signed)
Associated with vertigo. Given duration and severity of sxs will treat with doxy course. Update if sxs persist or fail to improve.

## 2015-03-10 NOTE — Progress Notes (Signed)
BP 124/94 mmHg  Pulse 66  Temp(Src) 98 F (36.7 C) (Oral)  Wt 205 lb 12.8 oz (93.35 kg)  LMP 02/06/2015   CC: "bad sinus infection" Subjective:    Patient ID: Hannah Stephenson, female    DOB: August 07, 1976, 39 y.o.   MRN: [id number]  HPI: Hannah Stephenson is a 39 y.o. female presenting on 03/10/2015 for Sinusitis   4-5d h/o pain/pressure in forehead, maxillary sinuses, pressure in ears down neck, marked dizziness described as vertigo associated with nausea. At times has to close her eyes due to vertigo. Blowing nose with green mucous. Marked pressure/pain. Acutely worsening since yesterday.  Did start feeling ill after working out in yard last Saturday. Taking sinus meds since 10d ago.  No fevers/chills, cough, tooth pain, ST or PNdrainage, chest congestion.  Has tried tylenol sinus, mucinex sinus. No sick contacts at home. No smokers at home.  Relevant past medical, surgical, family and social history reviewed and updated as indicated. Interim medical history since our last visit reviewed. Allergies and medications reviewed and updated. Current Outpatient Prescriptions on File Prior to Visit  Medication Sig  . ALPRAZolam (XANAX) 0.25 MG tablet Take one by mouth three times daily as needed Take one by mouth three times daily as needed  . diphenoxylate-atropine (LOMOTIL) 2.5-0.025 MG per tablet Take 1 tablet by mouth 4 (four) times daily as needed for diarrhea or loose stools.  . sertraline (ZOLOFT) 100 MG tablet Take 1 and 1/2 by mouth daily  . topiramate (TOPAMAX) 25 MG tablet Take 4 tablets by mouth at bedtime    No current facility-administered medications on file prior to visit.    Review of Systems Per HPI unless specifically indicated above     Objective:    BP 124/94 mmHg  Pulse 66  Temp(Src) 98 F (36.7 C) (Oral)  Wt 205 lb 12.8 oz (93.35 kg)  LMP 02/06/2015  Wt Readings from Last 3 Encounters:  03/10/15 205 lb 12.8 oz (93.35 kg)  08/06/14 214 lb (97.07 kg)    02/25/14 217 lb 8 oz (98.657 kg)    Physical Exam  Constitutional: She appears well-developed and well-nourished. No distress.  HENT:  Head: Normocephalic and atraumatic.  Right Ear: Hearing, tympanic membrane, external ear and ear canal normal.  Left Ear: Hearing, tympanic membrane, external ear and ear canal normal.  Nose: Mucosal edema present. No rhinorrhea. Right sinus exhibits maxillary sinus tenderness and frontal sinus tenderness. Left sinus exhibits maxillary sinus tenderness and frontal sinus tenderness.  Mouth/Throat: Uvula is midline, oropharynx is clear and moist and mucous membranes are normal. No oropharyngeal exudate, posterior oropharyngeal edema, posterior oropharyngeal erythema or tonsillar abscesses.  Eyes: Conjunctivae and EOM are normal. Pupils are equal, round, and reactive to light. No scleral icterus.  Neck: Normal range of motion. Neck supple.  Cardiovascular: Normal rate, regular rhythm, normal heart sounds and intact distal pulses.   No murmur heard. Pulmonary/Chest: Effort normal and breath sounds normal. No respiratory distress. She has no wheezes. She has no rales.  Lymphadenopathy:    She has no cervical adenopathy.  Skin: Skin is warm and dry. No rash noted.  Nursing note and vitals reviewed.     Assessment & Plan:   Problem List Items Addressed This Visit    Acute sinusitis - Primary    Associated with vertigo. Given duration and severity of sxs will treat with doxy course. Update if sxs persist or fail to improve.      Relevant Medications  doxy 10d       Follow up plan: Return if symptoms worsen or fail to improve.

## 2015-03-10 NOTE — Patient Instructions (Signed)
You have a sinus infection. Take medicine as prescribed: doxycycline course. Meclizine for nausea/vertigo as needed Push fluids and plenty of rest. Nasal saline irrigation or neti pot to help drain sinuses. May use plain mucinex with plenty of fluid to help mobilize mucous. Please let us know if fever >101.5, trouble opening/closing mouth, difficulty swallowing, or worsening instead of improving as expected.

## 2015-06-23 ENCOUNTER — Ambulatory Visit (INDEPENDENT_AMBULATORY_CARE_PROVIDER_SITE_OTHER): Payer: Commercial Managed Care - PPO | Admitting: Internal Medicine

## 2015-06-23 ENCOUNTER — Encounter: Payer: Self-pay | Admitting: Internal Medicine

## 2015-06-23 VITALS — BP 112/78 | HR 58 | Temp 99.0°F | Wt 205.0 lb

## 2015-06-23 DIAGNOSIS — F32A Depression, unspecified: Secondary | ICD-10-CM

## 2015-06-23 DIAGNOSIS — F419 Anxiety disorder, unspecified: Principal | ICD-10-CM

## 2015-06-23 DIAGNOSIS — F329 Major depressive disorder, single episode, unspecified: Secondary | ICD-10-CM

## 2015-06-23 DIAGNOSIS — F418 Other specified anxiety disorders: Secondary | ICD-10-CM

## 2015-06-23 LAB — TSH: TSH: 1.3 u[IU]/mL (ref 0.35–4.50)

## 2015-06-23 LAB — LUTEINIZING HORMONE: LH: 5.72 m[IU]/mL

## 2015-06-23 LAB — FOLLICLE STIMULATING HORMONE: FSH: 6.4 m[IU]/mL

## 2015-06-23 MED ORDER — ALPRAZOLAM 0.25 MG PO TABS: ORAL_TABLET | ORAL | Status: DC

## 2015-06-23 MED ORDER — SERTRALINE HCL 50 MG PO TABS: 50.0000 mg | ORAL_TABLET | Freq: Every day | ORAL | Status: DC

## 2015-06-23 NOTE — Progress Notes (Signed)
Pre visit review using our clinic review tool, if applicable. No additional management support is needed unless otherwise documented below in the visit note. 

## 2015-06-23 NOTE — Patient Instructions (Signed)

## 2015-06-23 NOTE — Progress Notes (Signed)
Subjective:    Patient ID: Hannah Stephenson, female    DOB: 09/03/1976, 39 y.o.   MRN: [id number]  HPI  Pt presents to the clinic today to discuss anxiety and depression. She reports she has not been on prescription medication for the last year. Over the last 3 months, things have gotten very stressful at work. She has been feeling very overwhelmed, and hopeless. She has started having panic attacks again. She feel chest tightness at time, fatigued, irritable and has a decreased appetite. She is sleeping more than usual, 13 hours last night. She does not want to get out of bed or leave the house. She has had 1 episode of feeling like life would be better off if she was "not here". She has had the feelings many years ago but reports she could never commit suicide because of her daughter. She has no active suicidal plan. She has failed Prozac, Paxil and Effexor in the past. Her most recent regimen was Zoloft and Xanax. She has not seen Dr. Deborra Medina since 06/11/13.  Review of Systems      Past Medical History  Diagnosis Date  . History of pancreatitis   . Scoliosis     mild  . OCD (obsessive compulsive disorder)   . Wrist fracture, left     Current Outpatient Prescriptions  Medication Sig Dispense Refill  . ALPRAZolam (XANAX) 0.25 MG tablet Take one by mouth three times daily as needed Take one by mouth three times daily as needed 30 tablet 0  . diphenoxylate-atropine (LOMOTIL) 2.5-0.025 MG per tablet Take 1 tablet by mouth 4 (four) times daily as needed for diarrhea or loose stools. 30 tablet 0  . doxycycline (VIBRAMYCIN) 100 MG capsule Take 1 capsule (100 mg total) by mouth 2 (two) times daily. 20 capsule 0  . meclizine (ANTIVERT) 25 MG tablet Take 1 tablet (25 mg total) by mouth 3 (three) times daily as needed for dizziness. 30 tablet 0  . sertraline (ZOLOFT) 100 MG tablet Take 1 and 1/2 by mouth daily 60 tablet 6  . topiramate (TOPAMAX) 25 MG tablet Take 4 tablets by mouth at bedtime       No current facility-administered medications for this visit.    Allergies  Allergen Reactions  . Penicillins     REACTION: severe ha    Family History  Problem Relation Age of Onset  . Depression Mother   . Depression Maternal Grandmother     History   Social History  . Marital Status: Married    Spouse Name: N/A  . Number of Children: 1  . Years of Education: N/A   Occupational History  . [employer]     Customer Service   Social History Main Topics  . Smoking status: Never Smoker   . Smokeless tobacco: Never Used  . Alcohol Use: No  . Drug Use: No  . Sexual Activity: Not on file   Other Topics Concern  . Not on file   Social History Narrative   Divorced   One girl     Constitutional: Denies fever, malaise, fatigue, headache or abrupt weight changes.  Respiratory: Denies difficulty breathing, shortness of breath, cough or sputum production.   Cardiovascular: Denies chest pain, chest tightness, palpitations or swelling in the hands or feet.  Neurological: Denies dizziness, difficulty with memory, difficulty with speech or problems with balance and coordination.  Psych: Pt reports anxiety and depression. Denies SI/HI.  No other specific complaints in a complete review of systems (  except as listed in HPI above).  Objective:   Physical Exam  BP 112/78 mmHg  Pulse 58  Temp(Src) 99 F (37.2 C) (Oral)  Wt 205 lb (92.987 kg)  SpO2 98%  LMP 06/18/2015 Wt Readings from Last 3 Encounters:  06/23/15 205 lb (92.987 kg)  03/10/15 205 lb 12.8 oz (93.35 kg)  08/06/14 214 lb (97.07 kg)    General: Appears her stated age, well developed, well nourished in NAD. Cardiovascular: Normal rate and rhythm. S1,S2 noted.  No murmur, rubs or gallops noted.  Pulmonary/Chest: Normal effort and positive vesicular breath sounds. No respiratory distress. No wheezes, rales or ronchi noted.  Neurological: Alert and oriented.  Psychiatric: She is tearful today. She appears  anxious. She is well groomed and making good eye contact.  BMET    Component Value Date/Time   NA 142 08/20/2010 1205   K 4.0 08/20/2010 1205   CL 107 08/20/2010 1205   CO2 25 08/20/2010 1205   GLUCOSE 76 08/20/2010 1205   BUN 18 08/20/2010 1205   CREATININE 0.6 08/20/2010 1205   CALCIUM 8.8 08/20/2010 1205   GFRNONAA 113.42 08/20/2010 1205    Lipid Panel  No results found for: CHOL, TRIG, HDL, CHOLHDL, VLDL, LDLCALC  CBC    Component Value Date/Time   WBC 8.7 12/25/2009 1247   RBC 4.18 12/25/2009 1247   HGB 13.4 12/25/2009 1247   HCT 41.1 12/25/2009 1247   PLT 337.0 12/25/2009 1247   MCV 98.2 12/25/2009 1247   MCHC 32.7 12/25/2009 1247   RDW 11.6 12/25/2009 1247   LYMPHSABS 2.7 12/25/2009 1247   MONOABS 0.6 12/25/2009 1247   EOSABS 0.1 12/25/2009 1247   BASOSABS 0.0 12/25/2009 1247    Hgb A1C No results found for: HGBA1C       Assessment & Plan:   RTC in 4 weeks to follow up anxiety and depression

## 2015-06-23 NOTE — Assessment & Plan Note (Signed)
Work related stress She is having trouble coping Support offered today Will check TSH, FSH, LH Will start Zoloft 50 mg QHS, eRx sent to pharmacy RX for Xanax 0.25 mg prn for panic attacks She does not feel like she has time for psychotherapy at this time.

## 2015-06-26 ENCOUNTER — Telehealth: Payer: Self-pay

## 2015-06-26 NOTE — Telephone Encounter (Signed)
Pt left v/m that she cannot get into mychart and request recent lab results; left v/m(per DPR) with result note on 06/23/15 labs.pt to cb if needed.

## 2015-12-30 ENCOUNTER — Other Ambulatory Visit: Payer: Self-pay

## 2015-12-30 DIAGNOSIS — F32A Depression, unspecified: Secondary | ICD-10-CM

## 2015-12-30 DIAGNOSIS — F329 Major depressive disorder, single episode, unspecified: Secondary | ICD-10-CM

## 2015-12-30 DIAGNOSIS — F419 Anxiety disorder, unspecified: Principal | ICD-10-CM

## 2015-12-30 MED ORDER — SERTRALINE HCL 50 MG PO TABS: 50.0000 mg | ORAL_TABLET | Freq: Every day | ORAL | Status: DC

## 2015-12-30 MED ORDER — ALPRAZOLAM 0.25 MG PO TABS: ORAL_TABLET | ORAL | Status: DC

## 2015-12-30 NOTE — Telephone Encounter (Signed)
Pt left v/m requesting refill alprazolam(last refilled # 30 on 06/23/15) and zoloft(last refilled # 30 x 2 on 06/23/15) [facility] [address]; last seen 06/23/15.Please advise.

## 2015-12-31 ENCOUNTER — Other Ambulatory Visit: Payer: Self-pay | Admitting: Internal Medicine

## 2015-12-31 NOTE — Telephone Encounter (Signed)
Rx called in to requested pharmacy 

## 2016-02-27 ENCOUNTER — Telehealth: Payer: Self-pay | Admitting: Family Medicine

## 2016-02-27 NOTE — Telephone Encounter (Signed)
[facility] Patient Name: Hannah Stephenson DOB: 09-05-1976 Initial Comment Caller states that she having body aches and fever and pretty sure she has flu. Nurse Assessment Nurse: Martyn Ehrich RN, Felicia Date/Time (Eastern Time): 02/27/2016 4:08:46 PM Confirm and document reason for call. If symptomatic, describe symptoms. You must click the next button to save text entered. ---PT has body aches onset Weds afternoon with congestion. Thurs am the fever Started. She has a cough as well. More congested in face. She thinks it is the Flu Has the patient traveled out of the country within the last 30 days? ---No Does the patient have any new or worsening symptoms? ---Yes Will a triage be completed? ---Yes Related visit to physician within the last 2 weeks? ---NoDoes the PT have any chronic conditions? (i.e. diabetes, asthma, etc.) ---No Is the patient pregnant or possibly pregnant? (Ask all females between the ages of 88-55) ---No Is this a behavioral health or substance abuse call? ---No Guidelines Guideline Title Affirmed Question Affirmed Notes Influenza - Seasonal Chest pain (Exception: MILD central chest pain, present only when coughing) Final Disposition User Go to ED Now Martyn Ehrich, RN,  Hospital - ED Disagree/Comply: Comply Call Id: [phone]

## 2016-03-31 DIAGNOSIS — N920 Excessive and frequent menstruation with regular cycle: Secondary | ICD-10-CM | POA: Diagnosis not present

## 2016-03-31 DIAGNOSIS — Z01419 Encounter for gynecological examination (general) (routine) without abnormal findings: Secondary | ICD-10-CM | POA: Diagnosis not present

## 2016-03-31 DIAGNOSIS — Z1231 Encounter for screening mammogram for malignant neoplasm of breast: Secondary | ICD-10-CM | POA: Diagnosis not present

## 2016-03-31 DIAGNOSIS — R102 Pelvic and perineal pain: Secondary | ICD-10-CM | POA: Diagnosis not present

## 2016-03-31 DIAGNOSIS — R300 Dysuria: Secondary | ICD-10-CM | POA: Diagnosis not present

## 2016-04-14 DIAGNOSIS — R1031 Right lower quadrant pain: Secondary | ICD-10-CM | POA: Diagnosis not present

## 2016-04-14 DIAGNOSIS — N939 Abnormal uterine and vaginal bleeding, unspecified: Secondary | ICD-10-CM | POA: Diagnosis not present

## 2016-04-14 DIAGNOSIS — N938 Other specified abnormal uterine and vaginal bleeding: Secondary | ICD-10-CM | POA: Diagnosis not present

## 2016-04-14 DIAGNOSIS — N920 Excessive and frequent menstruation with regular cycle: Secondary | ICD-10-CM | POA: Diagnosis not present

## 2016-04-14 DIAGNOSIS — D219 Benign neoplasm of connective and other soft tissue, unspecified: Secondary | ICD-10-CM | POA: Diagnosis not present

## 2016-04-14 DIAGNOSIS — R1032 Left lower quadrant pain: Secondary | ICD-10-CM | POA: Diagnosis not present

## 2016-04-21 DIAGNOSIS — N946 Dysmenorrhea, unspecified: Secondary | ICD-10-CM | POA: Diagnosis not present

## 2016-04-21 DIAGNOSIS — N920 Excessive and frequent menstruation with regular cycle: Secondary | ICD-10-CM | POA: Diagnosis not present

## 2016-05-11 DIAGNOSIS — N920 Excessive and frequent menstruation with regular cycle: Secondary | ICD-10-CM | POA: Diagnosis not present

## 2016-05-11 DIAGNOSIS — N946 Dysmenorrhea, unspecified: Secondary | ICD-10-CM | POA: Diagnosis not present

## 2016-05-11 DIAGNOSIS — D251 Intramural leiomyoma of uterus: Secondary | ICD-10-CM | POA: Diagnosis not present

## 2016-05-28 ENCOUNTER — Ambulatory Visit
Admission: RE | Admit: 2016-05-28 | Discharge: 2016-05-28 | Disposition: A | Discharge status: Home / Self Care | Payer: BLUE CROSS/BLUE SHIELD | Source: Ambulatory Visit | Attending: Obstetrics & Gynecology | Admitting: Obstetrics & Gynecology

## 2016-05-28 DIAGNOSIS — D251 Intramural leiomyoma of uterus: Secondary | ICD-10-CM | POA: Diagnosis not present

## 2016-05-28 DIAGNOSIS — N920 Excessive and frequent menstruation with regular cycle: Secondary | ICD-10-CM | POA: Insufficient documentation

## 2016-05-28 DIAGNOSIS — R102 Pelvic and perineal pain: Secondary | ICD-10-CM | POA: Insufficient documentation

## 2016-05-28 DIAGNOSIS — Z01812 Encounter for preprocedural laboratory examination: Secondary | ICD-10-CM | POA: Insufficient documentation

## 2016-05-28 DIAGNOSIS — D219 Benign neoplasm of connective and other soft tissue, unspecified: Secondary | ICD-10-CM | POA: Insufficient documentation

## 2016-05-28 DIAGNOSIS — N946 Dysmenorrhea, unspecified: Secondary | ICD-10-CM | POA: Diagnosis not present

## 2016-05-28 HISTORY — DX: Anxiety disorder, unspecified: F41.9

## 2016-05-28 HISTORY — DX: Major depressive disorder, single episode, unspecified: F32.9

## 2016-05-28 HISTORY — DX: Depression, unspecified: F32.A

## 2016-05-28 LAB — CBC
HEMATOCRIT: 37.5 % (ref 35.0–47.0)
HEMOGLOBIN: 12.3 g/dL (ref 12.0–16.0)
MCH: 30.9 pg (ref 26.0–34.0)
MCHC: 32.9 g/dL (ref 32.0–36.0)
MCV: 93.9 fL (ref 80.0–100.0)
Platelets: 322 10*3/uL (ref 150–440)
RBC: 3.99 MIL/uL (ref 3.80–5.20)
RDW: 12.9 % (ref 11.5–14.5)
WBC: 11.6 10*3/uL — AB (ref 3.6–11.0)

## 2016-05-28 LAB — TYPE AND SCREEN
ABO/RH(D): O POS
ANTIBODY SCREEN: NEGATIVE

## 2016-05-28 NOTE — Patient Instructions (Signed)
Your procedure is scheduled TD:[id number] 06/01/16 Report to Day Surgery. 2nd floor medical mall entrance To find out your arrival time please call [phone] between 1PM - 3PM on 05/31/16.  Remember: Instructions that are not followed completely may result in serious medical risk, up to and including death, or upon the discretion of your surgeon and anesthesiologist your surgery may need to be rescheduled.    __X__ 1. Do not eat food or drink liquids after midnight. No gum chewing or hard candies.     __X__ 2. No Alcohol for 24 hours before or after surgery.   ____ 3. Bring all medications with you on the day of surgery if instructed.    __X__ 4. Notify your doctor if there is any change in your medical condition     (cold, fever, infections).     Do not wear jewelry, make-up, hairpins, clips or nail polish.  Do not wear lotions, powders, or perfumes.   Do not shave 48 hours prior to surgery. Men may shave face and neck.  Do not bring valuables to the hospital.    [facility] is not responsible for any belongings or valuables.               Contacts, dentures or bridgework may not be worn into surgery.  Leave your suitcase in the car. After surgery it may be brought to your room.  For patients admitted to the hospital, discharge time is determined by your                treatment team.   Patients discharged the day of surgery will not be allowed to drive home.   Please read over the following fact sheets that you were given:   Surgical Site Infection Prevention   ____ Take these medicines the morning of surgery with A SIP OF WATER:    1. May take alprazolam if needed  2.   3.   4.  5.  6.  ____ Fleet Enema (as directed)   __x__ Use CHG Soap as directed  ____ Use inhalers on the day of surgery  ____ Stop metformin 2 days prior to surgery    ____ Take 1/2 of usual insulin dose the night before surgery and none on the morning of surgery.   ____ Stop  Coumadin/Plavix/aspirin on   ____ Stop Anti-inflammatories on no nsaids (advil,aleve, ibuprofen, aspirin) ok to take tylenol   ____ Stop supplements until after surgery.    ____ Bring C-Pap to the hospital.

## 2016-06-01 ENCOUNTER — Ambulatory Visit: Payer: BLUE CROSS/BLUE SHIELD | Admitting: Anesthesiology

## 2016-06-01 ENCOUNTER — Encounter
Admission: RE | Disposition: A | Discharge status: Home / Self Care | Payer: Self-pay | Source: Ambulatory Visit | Attending: Obstetrics & Gynecology

## 2016-06-01 ENCOUNTER — Ambulatory Visit
Admission: RE | Admit: 2016-06-01 | Discharge: 2016-06-01 | Disposition: A | Discharge status: Home / Self Care | Payer: BLUE CROSS/BLUE SHIELD | Source: Ambulatory Visit | Attending: Obstetrics & Gynecology | Admitting: Obstetrics & Gynecology

## 2016-06-01 ENCOUNTER — Encounter: Payer: Self-pay | Admitting: *Deleted

## 2016-06-01 DIAGNOSIS — F329 Major depressive disorder, single episode, unspecified: Secondary | ICD-10-CM | POA: Insufficient documentation

## 2016-06-01 DIAGNOSIS — D259 Leiomyoma of uterus, unspecified: Secondary | ICD-10-CM | POA: Diagnosis not present

## 2016-06-01 DIAGNOSIS — K660 Peritoneal adhesions (postprocedural) (postinfection): Secondary | ICD-10-CM | POA: Diagnosis not present

## 2016-06-01 DIAGNOSIS — N920 Excessive and frequent menstruation with regular cycle: Secondary | ICD-10-CM | POA: Diagnosis not present

## 2016-06-01 DIAGNOSIS — D390 Neoplasm of uncertain behavior of uterus: Secondary | ICD-10-CM | POA: Diagnosis not present

## 2016-06-01 DIAGNOSIS — G8929 Other chronic pain: Secondary | ICD-10-CM | POA: Diagnosis not present

## 2016-06-01 DIAGNOSIS — N72 Inflammatory disease of cervix uteri: Secondary | ICD-10-CM | POA: Insufficient documentation

## 2016-06-01 DIAGNOSIS — D251 Intramural leiomyoma of uterus: Secondary | ICD-10-CM | POA: Diagnosis not present

## 2016-06-01 DIAGNOSIS — Q504 Embryonic cyst of fallopian tube: Secondary | ICD-10-CM | POA: Diagnosis not present

## 2016-06-01 DIAGNOSIS — F419 Anxiety disorder, unspecified: Secondary | ICD-10-CM | POA: Diagnosis not present

## 2016-06-01 DIAGNOSIS — N83202 Unspecified ovarian cyst, left side: Secondary | ICD-10-CM | POA: Insufficient documentation

## 2016-06-01 DIAGNOSIS — R102 Pelvic and perineal pain: Secondary | ICD-10-CM | POA: Diagnosis not present

## 2016-06-01 DIAGNOSIS — N946 Dysmenorrhea, unspecified: Secondary | ICD-10-CM | POA: Diagnosis present

## 2016-06-01 DIAGNOSIS — N838 Other noninflammatory disorders of ovary, fallopian tube and broad ligament: Secondary | ICD-10-CM | POA: Diagnosis not present

## 2016-06-01 HISTORY — PX: LAPAROSCOPIC HYSTERECTOMY: SHX1926

## 2016-06-01 HISTORY — PX: CYSTOSCOPY: SHX5120

## 2016-06-01 LAB — POCT PREGNANCY, URINE: PREG TEST UR: NEGATIVE

## 2016-06-01 SURGERY — HYSTERECTOMY, TOTAL, LAPAROSCOPIC
Anesthesia: General | Laterality: Bilateral

## 2016-06-01 MED ORDER — OXYCODONE-ACETAMINOPHEN 5-325 MG PO TABS
ORAL_TABLET | ORAL | Status: AC
  Administered 2016-06-01: 1 via ORAL
  Filled 2016-06-01: qty 1

## 2016-06-01 MED ORDER — BUPIVACAINE HCL (PF) 0.5 % IJ SOLN
INTRAMUSCULAR | Status: AC
  Filled 2016-06-01: qty 30

## 2016-06-01 MED ORDER — ACETAMINOPHEN 10 MG/ML IV SOLN
INTRAVENOUS | Status: AC
  Filled 2016-06-01: qty 100

## 2016-06-01 MED ORDER — CLINDAMYCIN PHOSPHATE 900 MG/50ML IV SOLN
INTRAVENOUS | Status: AC
  Administered 2016-06-01: 900 mg via INTRAVENOUS
  Filled 2016-06-01: qty 50

## 2016-06-01 MED ORDER — PHENYLEPHRINE HCL 10 MG/ML IJ SOLN
INTRAMUSCULAR | Status: DC | PRN
  Administered 2016-06-01: 100 ug via INTRAVENOUS

## 2016-06-01 MED ORDER — FAMOTIDINE 20 MG PO TABS
ORAL_TABLET | ORAL | Status: AC
  Administered 2016-06-01: 20 mg via ORAL
  Filled 2016-06-01: qty 1

## 2016-06-01 MED ORDER — GLYCOPYRROLATE 0.2 MG/ML IJ SOLN
INTRAMUSCULAR | Status: DC | PRN
  Administered 2016-06-01: 0.2 mg via INTRAVENOUS

## 2016-06-01 MED ORDER — CLINDAMYCIN PHOSPHATE 900 MG/50ML IV SOLN: 900.0000 mg | Freq: Once | INTRAVENOUS | Status: DC

## 2016-06-01 MED ORDER — PROPOFOL 10 MG/ML IV BOLUS
INTRAVENOUS | Status: DC | PRN
  Administered 2016-06-01: 20 mg via INTRAVENOUS
  Administered 2016-06-01: 180 mg via INTRAVENOUS

## 2016-06-01 MED ORDER — LIDOCAINE HCL (CARDIAC) 20 MG/ML IV SOLN
INTRAVENOUS | Status: DC | PRN
  Administered 2016-06-01 (×2): 100 mg via INTRAVENOUS

## 2016-06-01 MED ORDER — KETOROLAC TROMETHAMINE 30 MG/ML IJ SOLN
INTRAMUSCULAR | Status: DC | PRN
  Administered 2016-06-01: 30 mg via INTRAVENOUS

## 2016-06-01 MED ORDER — ONDANSETRON HCL 4 MG/2ML IJ SOLN
INTRAMUSCULAR | Status: AC
  Filled 2016-06-01: qty 2

## 2016-06-01 MED ORDER — KETAMINE HCL 50 MG/ML IJ SOLN
INTRAMUSCULAR | Status: DC | PRN
  Administered 2016-06-01: 30 mg via INTRAMUSCULAR

## 2016-06-01 MED ORDER — LACTATED RINGERS IV SOLN
INTRAVENOUS | Status: DC
  Administered 2016-06-01: 1000 mL via INTRAVENOUS
  Administered 2016-06-01: 07:00:00 via INTRAVENOUS

## 2016-06-01 MED ORDER — OXYCODONE-ACETAMINOPHEN 5-325 MG PO TABS
1.0000 | ORAL_TABLET | ORAL | Status: DC | PRN
  Administered 2016-06-01: 1 via ORAL

## 2016-06-01 MED ORDER — SUGAMMADEX SODIUM 200 MG/2ML IV SOLN
INTRAVENOUS | Status: DC | PRN
  Administered 2016-06-01: 200 mg via INTRAVENOUS

## 2016-06-01 MED ORDER — OXYCODONE-ACETAMINOPHEN 5-325 MG PO TABS: 1.0000 | ORAL_TABLET | ORAL | Status: DC | PRN

## 2016-06-01 MED ORDER — FENTANYL CITRATE (PF) 100 MCG/2ML IJ SOLN
INTRAMUSCULAR | Status: AC
  Filled 2016-06-01: qty 2

## 2016-06-01 MED ORDER — DEXAMETHASONE SODIUM PHOSPHATE 10 MG/ML IJ SOLN
INTRAMUSCULAR | Status: DC | PRN
  Administered 2016-06-01: 10 mg via INTRAVENOUS

## 2016-06-01 MED ORDER — FENTANYL CITRATE (PF) 100 MCG/2ML IJ SOLN
25.0000 ug | INTRAMUSCULAR | Status: AC | PRN
  Administered 2016-06-01 (×6): 25 ug via INTRAVENOUS

## 2016-06-01 MED ORDER — ONDANSETRON HCL 4 MG/2ML IJ SOLN
INTRAMUSCULAR | Status: DC | PRN
  Administered 2016-06-01: 4 mg via INTRAVENOUS

## 2016-06-01 MED ORDER — FENTANYL CITRATE (PF) 100 MCG/2ML IJ SOLN
INTRAMUSCULAR | Status: DC | PRN
  Administered 2016-06-01: 50 ug via INTRAVENOUS
  Administered 2016-06-01: 100 ug via INTRAVENOUS
  Administered 2016-06-01 (×2): 50 ug via INTRAVENOUS

## 2016-06-01 MED ORDER — ROCURONIUM BROMIDE 100 MG/10ML IV SOLN
INTRAVENOUS | Status: DC | PRN
  Administered 2016-06-01: 50 mg via INTRAVENOUS

## 2016-06-01 MED ORDER — EPHEDRINE SULFATE 50 MG/ML IJ SOLN
INTRAMUSCULAR | Status: DC | PRN
  Administered 2016-06-01 (×2): 5 mg via INTRAVENOUS

## 2016-06-01 MED ORDER — BUPIVACAINE HCL (PF) 0.5 % IJ SOLN
INTRAMUSCULAR | Status: DC | PRN
  Administered 2016-06-01: 20 mL

## 2016-06-01 MED ORDER — ACETAMINOPHEN 10 MG/ML IV SOLN
INTRAVENOUS | Status: DC | PRN
  Administered 2016-06-01: 1000 mg via INTRAVENOUS

## 2016-06-01 MED ORDER — FAMOTIDINE 20 MG PO TABS
20.0000 mg | ORAL_TABLET | Freq: Once | ORAL | Status: AC
  Administered 2016-06-01: 20 mg via ORAL

## 2016-06-01 MED ORDER — ONDANSETRON HCL 4 MG/2ML IJ SOLN
4.0000 mg | Freq: Once | INTRAMUSCULAR | Status: AC | PRN
  Administered 2016-06-01: 4 mg via INTRAVENOUS

## 2016-06-01 SURGICAL SUPPLY — 52 items
BAG URO DRAIN 2000ML W/SPOUT (MISCELLANEOUS) ×3 IMPLANT
BLADE SURG SZ11 CARB STEEL (BLADE) ×6 IMPLANT
CANISTER SUCT 1200ML W/VALVE (MISCELLANEOUS) ×3 IMPLANT
CATH FOLEY 2WAY  5CC 16FR (CATHETERS) ×1
CATH URTH 16FR FL 2W BLN LF (CATHETERS) ×2 IMPLANT
CHLORAPREP W/TINT 26ML (MISCELLANEOUS) ×3 IMPLANT
DEFOGGER SCOPE WARMER CLEARIFY (MISCELLANEOUS) ×3 IMPLANT
DEVICE SUTURE ENDOST 10MM (ENDOMECHANICALS) ×3 IMPLANT
DRAPE CAMERA CLOSED 9X96 (DRAPES) ×3 IMPLANT
DRSG TEGADERM 2-3/8X2-3/4 SM (GAUZE/BANDAGES/DRESSINGS) ×12 IMPLANT
ENDOSTITCH 0 SINGLE 48 (SUTURE) ×24 IMPLANT
GAUZE SPONGE NON-WVN 2X2 STRL (MISCELLANEOUS) ×4 IMPLANT
GLOVE BIO SURGEON STRL SZ8 (GLOVE) ×15 IMPLANT
GLOVE INDICATOR 7.5 STRL GRN (GLOVE) ×3 IMPLANT
GLOVE INDICATOR 8.0 STRL GRN (GLOVE) ×3 IMPLANT
GLOVE SKINSENSE NS SZ8.0 LF (GLOVE) ×2
GLOVE SKINSENSE STRL SZ8.0 LF (GLOVE) ×4 IMPLANT
GLOVE SURG SYN 7.0 (GLOVE) ×3 IMPLANT
GOWN STRL REUS W/ TWL LRG LVL3 (GOWN DISPOSABLE) ×2 IMPLANT
GOWN STRL REUS W/ TWL XL LVL3 (GOWN DISPOSABLE) ×4 IMPLANT
GOWN STRL REUS W/TWL LRG LVL3 (GOWN DISPOSABLE) ×1
GOWN STRL REUS W/TWL XL LVL3 (GOWN DISPOSABLE) ×2
IRRIGATION STRYKERFLOW (MISCELLANEOUS) ×2 IMPLANT
IRRIGATOR STRYKERFLOW (MISCELLANEOUS) ×3
IV LACTATED RINGERS 1000ML (IV SOLUTION) ×3 IMPLANT
KIT PINK PAD W/HEAD ARE REST (MISCELLANEOUS) ×3
KIT PINK PAD W/HEAD ARM REST (MISCELLANEOUS) ×2 IMPLANT
KIT RM TURNOVER CYSTO AR (KITS) ×3 IMPLANT
LABEL OR SOLS (LABEL) ×3 IMPLANT
LIQUID BAND (GAUZE/BANDAGES/DRESSINGS) ×3 IMPLANT
MANIPULATOR VCARE LG CRV RETR (MISCELLANEOUS) IMPLANT
MANIPULATOR VCARE SML CRV RETR (MISCELLANEOUS) IMPLANT
MANIPULATOR VCARE STD CRV RETR (MISCELLANEOUS) ×3 IMPLANT
NEEDLE VERESS 14GA 120MM (NEEDLE) ×3 IMPLANT
NS IRRIG 500ML POUR BTL (IV SOLUTION) ×3 IMPLANT
OCCLUDER COLPOPNEUMO (BALLOONS) ×3 IMPLANT
PACK GYN LAPAROSCOPIC (MISCELLANEOUS) ×3 IMPLANT
PAD OB MATERNITY 4.3X12.25 (PERSONAL CARE ITEMS) ×3 IMPLANT
PAD PREP 24X41 OB/GYN DISP (PERSONAL CARE ITEMS) ×3 IMPLANT
SCISSORS METZENBAUM CVD 33 (INSTRUMENTS) IMPLANT
SET CYSTO W/LG BORE CLAMP LF (SET/KITS/TRAYS/PACK) ×3 IMPLANT
SHEARS HARMONIC ACE PLUS 36CM (ENDOMECHANICALS) ×3 IMPLANT
SLEEVE ENDOPATH XCEL 5M (ENDOMECHANICALS) ×3 IMPLANT
SPONGE LAP 18X18 5 PK (GAUZE/BANDAGES/DRESSINGS) ×3 IMPLANT
SPONGE VERSALON 2X2 STRL (MISCELLANEOUS) ×2
SUT VIC AB 2-0 UR6 27 (SUTURE) ×3 IMPLANT
SYR 50ML LL SCALE MARK (SYRINGE) ×3 IMPLANT
SYRINGE 10CC LL (SYRINGE) ×3 IMPLANT
TROCAR 5M 150ML BLDLS (TROCAR) ×3 IMPLANT
TROCAR ENDO BLADELESS 11MM (ENDOMECHANICALS) ×3 IMPLANT
TROCAR XCEL NON-BLD 5MMX100MML (ENDOMECHANICALS) ×3 IMPLANT
TUBING INSUFFLATOR HEATED (MISCELLANEOUS) ×3 IMPLANT

## 2016-06-01 NOTE — Anesthesia Postprocedure Evaluation (Signed)
Anesthesia Post Note  Patient: Leighton Parody  Procedure(s) Performed: Procedure(s) (LRB): HYSTERECTOMY TOTAL LAPAROSCOPIC/ BILATERAL SALPINGECTOMY (Bilateral) CYSTOSCOPY  Patient location during evaluation: PACU Anesthesia Type: General Level of consciousness: awake Pain management: satisfactory to patient Vital Signs Assessment: post-procedure vital signs reviewed and stable Respiratory status: nonlabored ventilation Cardiovascular status: blood pressure returned to baseline Anesthetic complications: no    Last Vitals:  Filed Vitals:   06/01/16 1015 06/01/16 1025  BP: 135/81 126/76  Pulse: 90 93  Temp:    Resp: 12 11    Last Pain:  Filed Vitals:   06/01/16 1031  PainSc: 6                  VAN STAVEREN,Sabryna Lahm

## 2016-06-01 NOTE — Op Note (Signed)
Operative Note:  PRE-OP DIAGNOSIS: Menorrhagia, Dysmenorrhea, Fibroid Uterus  POST-OP DIAGNOSIS: Same   PROCEDURE: Procedure(s): HYSTERECTOMY- TOTAL LAPAROSCOPIC/ BILATERAL SALPINGECTOMY CYSTOSCOPY  SURGEON: Barnett Applebaum, MD, FACOG  ASSISTANT: Dr Georgianne Fick   ANESTHESIA: General endotracheal anesthesia  ESTIMATED BLOOD LOSS: 100 mL  SPECIMENS: Uterus, Tubes.  COMPLICATIONS: None  DISPOSITION: stable to PACU  FINDINGS: Intraabdominal adhesions were not noted. Small clear left ovary cyst.  Normal right ovary.  Enlarged fibroid +/-adenomysois uterus.  Normal liver and gall bladder.  PROCEDURE:  The patient was taken to the OR where anesthesia was administed. She was prepped and draped in the normal sterile fashion in the dorsal lithotomy position in the [place] stirrups. A time out was performed. Foley placed. A Graves speculum was inserted, the cervix was grasped with a single tooth tenaculum and the endometrial cavity was sounded. The cervix was progressively dilated to a size 18 [place] with [employer] dilators. A V-Care uterine manipulator was inserted in the usual fashion without incident. Gloves were changed and attention was turned to the abdomen.   An infraumbilical transverse 25mm skin incision was made with the scalpel after local anesthesia applied to the skin. A Veress-step needle was inserted in the usual fashion and confirmed using the hanging drop technique. A pneumoperitoneum was obtained by insufflation of CO2 (opening pressure of 82mmHg) to 60mmHg. A diagnostic laparoscopy was performed yielding the previously described findings.  A 5 mm torcar was placed in the [facility]). Attention was turned to the left lower quadrant where after visualization of the inferior epigastric vessels a 75mm skin incision was made with the scalpel. A 5 mm laparoscopic port was inserted. The same procedure was repeated in the right lower quadrant with a 42mm trocar. Attention was turned to the left  aspect of the uterus, where after visualization of the ureter, the round ligament was coagulated and transected using the 31mm Harmonic Scapel. The anterior and posterior leafs of the broad ligament were dissected off as the anterior one was coagulated and transected in a caudal direction towards the cuff of the uterine manipulator. The vesicouterine reflection of the peritoneum was dissected with the harmonic scapel and the bladder flap was created bluntly. Attention was then turned to the left fallopian tube which was recognized by visualization of the fimbria. First the mesosalpinx and then the utero-ovarian ligament were coagulated and transected using the Harmonic Scapel. Attention was turned to the right aspect of the uterus where the same procedure was performed. The uterine vessels were sealed and transected bilaterally using the harmonic scapel. A 360 degree, circumferential colpotomy was done to completely amputate the uterus with cervix and tubes. Once the specimen was amputated it was delivered through the vagina with morcellation from the vagina to complete the extraction.   The colpotomy was repaired in a simple interrupted fashion using a 0-Polysorb suture with an endo-stitch device.  Vaginal exam confirms complete closure.  The cavity was copiously irrigated. A survey of the pelvic cavity revealed adequate hemostasis and no injury to bowel, bladder, or ureter.  A diagnostic cystoscopy was performed using saline distension of bladder, with bilateral urine flow from each ureteral orifice.  No bladder lesions or injuries seen.    At this point the procedure was finalized. All the instruments were removed from the patient's body. Gas was expelled and patient is leveled.  Incisions are closed with skin adhesive.    Patient goes to recovery room in stable condition.  All sponge, instrument, and needle counts are correct  x2.

## 2016-06-01 NOTE — H&P (Signed)
History and Physical Interval Note:  06/01/2016 6:57 AM  Hannah Stephenson  has presented today for surgery, with the diagnosis of chronic pelvic pain and FIBROID UTERUS.  The various methods of treatment have been discussed with the patient and family. After consideration of risks, benefits and other options for treatment, the patient has consented to  Procedure(s): HYSTERECTOMY TOTAL LAPAROSCOPIC/ BILATERAL SALPINGECTOMY (Bilateral) and Cystoscopy as a surgical intervention .  The patient's history has been reviewed, patient examined, no change in status, stable for surgery.  Pt has the following beta blocker history-  Not taking Beta Blocker.  I have reviewed the patient's chart and labs.  Questions were answered to the patient's satisfaction.    Hoyt Koch

## 2016-06-01 NOTE — Discharge Instructions (Signed)
Total Laparoscopic Hysterectomy, Care After Refer to this sheet in the next few weeks. These instructions provide you with information on caring for yourself after your procedure. Your health care provider may also give you more specific instructions. Your treatment has been planned according to current medical practices, but problems sometimes occur. Call your health care provider if you have any problems or questions after your procedure. WHAT TO EXPECT AFTER THE PROCEDURE  Pain and bruising at the incision sites. You will be given pain medicine to control it.  Menopausal symptoms such as hot flashes, night sweats, and insomnia if your ovaries were removed.  Sore throat from the breathing tube that was inserted during surgery. HOME CARE INSTRUCTIONS  Only take over-the-counter or prescription medicines for pain, discomfort, or fever as directed by your health care provider.   Do not take aspirin. It can cause bleeding.   Do not drive when taking pain medicine.   Follow your health care provider's advice regarding diet, exercise, lifting, driving, and general activities.   Resume your usual diet as directed and allowed.   Get plenty of rest and sleep.   Do not douche, use tampons, or have sexual intercourse for at least 6 weeks, or until your health care provider gives you permission.   REMOVE your bandages (dressings) as directed by your health care provider in 1-2 DAYS.   Monitor your temperature and notify your health care provider of a fever.   Take showers instead of baths for 2-3 weeks.   Do not drink alcohol until your health care provider gives you permission.   If you develop constipation, you may take a mild laxative with your health care provider's permission. Bran foods may help with constipation problems. Drinking enough fluids to keep your urine clear or pale yellow may help as well.   Try to have someone home with you for 1-2 weeks to help around the  house.   Keep all of your follow-up appointments as directed by your health care provider.  SEEK MEDICAL CARE IF:  You have swelling, redness, or increasing pain around your incision sites.   You have pus coming from your incision.   You notice a bad smell coming from your incision.   Your incision breaks open.   You feel dizzy or lightheaded.   You have pain or bleeding when you urinate.   You have persistent diarrhea.   You have persistent nausea and vomiting.   You have abnormal vaginal discharge.   You have a rash.   You have any type of abnormal reaction or develop an allergy to your medicine.   You have poor pain control with your prescribed medicine.  SEEK IMMEDIATE MEDICAL CARE IF:  You have chest pain or shortness of breath.  You have severe abdominal pain that is not relieved with pain medicine.  You have pain or swelling in your legs. MAKE SURE YOU:  Understand these instructions.  Will watch your condition.  Will get help right away if you are not doing well or get worse.   This information is not intended to replace advice given to you by your health care provider. Make sure you discuss any questions you have with your health care provider.   Document Released: 09/19/2013 Document Revised: 12/04/2013 Document Reviewed: 09/19/2013 Elsevier Interactive Patient Education 2016 [place]   1) The drugs that you were given will stay in your system until tomorrow so for the next 24  hours you should not:  A) Drive an automobile B) Make any legal decisions C) Drink any alcoholic beverage   2) You may resume regular meals tomorrow.  Today it is better to start with liquids and gradually work up to solid foods.  You may eat anything you prefer, but it is better to start with liquids, then soup and crackers, and gradually work up to solid foods.   3) Please notify your doctor immediately  if you have any unusual bleeding, trouble breathing, redness and pain at the surgery site, drainage, fever, or pain not relieved by medication.   4) Additional Instructions: start a stool softener to decrease risk of constipation from your pain medicine  Please contact your physician with any problems or Same Day Surgery at [phone], Monday through Friday 6 am to 4 pm, or [place] at [facility] number at [phone].

## 2016-06-01 NOTE — Anesthesia Procedure Notes (Signed)
Procedure Name: Intubation Date/Time: 06/01/2016 7:33 AM Performed by: Alda Berthold Pre-anesthesia Checklist: Patient identified Patient Re-evaluated:Patient Re-evaluated prior to inductionOxygen Delivery Method: Circle system utilized Preoxygenation: Pre-oxygenation with 100% oxygen Intubation Type: IV induction Ventilation: Mask ventilation without difficulty Laryngoscope Size: Mac and 3 Grade View: Grade I Tube type: Oral Tube size: 6.5 mm Number of attempts: 1 Placement Confirmation: ETT inserted through vocal cords under direct vision Secured at: 21 cm Tube secured with: Tape Dental Injury: Teeth and Oropharynx as per pre-operative assessment

## 2016-06-01 NOTE — Anesthesia Preprocedure Evaluation (Signed)
Anesthesia Evaluation  Patient identified by MRN, date of birth, ID band Patient awake    Reviewed: Allergy & Precautions, NPO status , Patient's Chart, lab work & pertinent test results  History of Anesthesia Complications Negative for: history of anesthetic complications  Airway Mallampati: II       Dental  (+) Caps   Pulmonary neg pulmonary ROS,    breath sounds clear to auscultation       Cardiovascular Exercise Tolerance: Good  Rhythm:Regular Rate:Normal     Neuro/Psych Anxiety Depression    GI/Hepatic negative GI ROS, Neg liver ROS,   Endo/Other  Morbid obesity  Renal/GU      Musculoskeletal negative musculoskeletal ROS (+)   Abdominal (+) + obese,   Peds negative pediatric ROS (+)  Hematology   Anesthesia Other Findings   Reproductive/Obstetrics                             Anesthesia Physical Anesthesia Plan  ASA: II  Anesthesia Plan: General   Post-op Pain Management:    Induction: Intravenous  Airway Management Planned: Oral ETT  Additional Equipment:   Intra-op Plan:   Post-operative Plan: Extubation in OR  Informed Consent: I have reviewed the patients History and Physical, chart, labs and discussed the procedure including the risks, benefits and alternatives for the proposed anesthesia with the patient or authorized representative who has indicated his/her understanding and acceptance.     Plan Discussed with: CRNA  Anesthesia Plan Comments:         Anesthesia Quick Evaluation

## 2016-06-01 NOTE — Transfer of Care (Signed)
Immediate Anesthesia Transfer of Care Note  Patient: Hannah Stephenson  Procedure(s) Performed: Procedure(s): HYSTERECTOMY TOTAL LAPAROSCOPIC/ BILATERAL SALPINGECTOMY (Bilateral) CYSTOSCOPY  Patient Location: PACU  Anesthesia Type:General  Level of Consciousness: awake, alert , oriented and patient cooperative  Airway & Oxygen Therapy: Patient Spontanous Breathing and Patient connected to nasal cannula oxygen  Post-op Assessment: Report given to RN, Post -op Vital signs reviewed and stable and Patient moving all extremities  Post vital signs: Reviewed and stable  Last Vitals:  Filed Vitals:   06/01/16 0620 06/01/16 0959  BP: 140/106 118/67  Pulse: 86 97  Temp: 35.7 C 37.3 C  Resp: 16 14    Last Pain: There were no vitals filed for this visit.       Complications: No apparent anesthesia complications

## 2016-06-21 LAB — SURGICAL PATHOLOGY

## 2016-06-22 ENCOUNTER — Other Ambulatory Visit: Payer: Self-pay | Admitting: Family Medicine

## 2016-06-22 DIAGNOSIS — F32A Depression, unspecified: Secondary | ICD-10-CM

## 2016-06-22 DIAGNOSIS — F329 Major depressive disorder, single episode, unspecified: Secondary | ICD-10-CM

## 2016-06-22 DIAGNOSIS — F419 Anxiety disorder, unspecified: Principal | ICD-10-CM

## 2016-06-23 NOTE — Telephone Encounter (Signed)
Ok to refill one time only. Needs 30 min OV with me for further refills.

## 2016-06-23 NOTE — Telephone Encounter (Signed)
Pt has seen RBaity for anxiety f/u, but has not seen Dr Deborra Medina since 05/2013. pls advise

## 2016-06-24 NOTE — Telephone Encounter (Signed)
Lm on pts vm requesting a call back 

## 2016-06-29 MED ORDER — ALPRAZOLAM 0.25 MG PO TABS: ORAL_TABLET | ORAL | Status: DC

## 2016-06-29 NOTE — Telephone Encounter (Signed)
Pt left v/m requesting cb. 

## 2016-06-29 NOTE — Telephone Encounter (Signed)
Spoke to pt and informed her OV required. Scheduled as advised and Rx called in to requested pharmacy

## 2016-06-29 NOTE — Addendum Note (Signed)
Addended by: Modena Nunnery on: 06/29/2016 03:39 PM   Modules accepted: Orders

## 2016-07-08 ENCOUNTER — Ambulatory Visit (INDEPENDENT_AMBULATORY_CARE_PROVIDER_SITE_OTHER): Payer: BLUE CROSS/BLUE SHIELD | Admitting: Family Medicine

## 2016-07-08 ENCOUNTER — Encounter: Payer: Self-pay | Admitting: Family Medicine

## 2016-07-08 VITALS — BP 114/88 | HR 75 | Temp 98.6°F | Wt 224.0 lb

## 2016-07-08 DIAGNOSIS — F341 Dysthymic disorder: Secondary | ICD-10-CM

## 2016-07-08 MED ORDER — SERTRALINE HCL 25 MG PO TABS: 25.0000 mg | ORAL_TABLET | Freq: Every day | ORAL | 3 refills | Status: DC

## 2016-07-08 NOTE — Progress Notes (Signed)
Subjective:   Patient ID: Hannah Stephenson, female    DOB: 1976-09-16, 40 y.o.   MRN: [id number]  Hannah Stephenson is a pleasant 40 y.o. year old female who presents to clinic today with Follow-up (anxiety)  on 07/08/2016  HPI:  Last saw Webb Silversmith for anxiety/depression on 06/23/15. Note reviewed.  Started zoloft 50 mg daily and xanax 0.25 mg as needed. She declined psychotherapy at the time. She feels much better since she changed jobs.  Actually only taking zoloft 25 mg daily with xanax as needed- maybe twice a month.  Wt Readings from Last 3 Encounters:  07/08/16 224 lb (101.6 kg)  06/01/16 225 lb (102.1 kg)  05/28/16 225 lb (102.1 kg)     Current Outpatient Prescriptions on File Prior to Visit  Medication Sig Dispense Refill  . ALPRAZolam (XANAX) 0.25 MG tablet Take one by mouth three times daily as needed Take one by mouth three times daily as needed 20 tablet 0  . sertraline (ZOLOFT) 25 MG tablet Take 25 mg by mouth daily.     No current facility-administered medications on file prior to visit.     Allergies  Allergen Reactions  . Penicillins Itching    REACTION: feels hot, nauseated, and headache.     Past Medical History:  Diagnosis Date  . Anxiety   . Depression   . History of pancreatitis   . OCD (obsessive compulsive disorder)   . Scoliosis    mild  . Wrist fracture, left     Past Surgical History:  Procedure Laterality Date  . CESAREAN SECTION    . CYSTOSCOPY  06/01/2016   Procedure: CYSTOSCOPY;  Surgeon: Gae Dry, MD;  Location: [facility] ORS;  Service: Gynecology;;  . DILATION AND CURETTAGE OF UTERUS    . LAPAROSCOPIC HYSTERECTOMY Bilateral 06/01/2016   Procedure: HYSTERECTOMY TOTAL LAPAROSCOPIC/ BILATERAL SALPINGECTOMY;  Surgeon: Gae Dry, MD;  Location: [facility] ORS;  Service: Gynecology;  Laterality: Bilateral;  . PILONIDAL CYST EXCISION    . UPPER GI ENDOSCOPY    . WISDOM TOOTH EXTRACTION      Family History  Problem Relation Age of  Onset  . Depression Mother   . Depression Maternal Grandmother     Social History   Social History  . Marital status: Married    Spouse name: N/A  . Number of children: 1  . Years of education: N/A   Occupational History  . [employer]   Social History Main Topics  . Smoking status: Never Smoker  . Smokeless tobacco: Never Used  . Alcohol use No  . Drug use: No  . Sexual activity: Not on file   Other Topics Concern  . Not on file   Social History Narrative   Divorced   One girl   The PMH, PSH, Social History, Family History, Medications, and allergies have been reviewed in [facility], and have been updated if relevant.   Review of Systems  Constitutional: Negative.   HENT: Negative.   Eyes: Negative.   Respiratory: Negative.   Cardiovascular: Negative.   Gastrointestinal: Negative.   Endocrine: Negative.   Genitourinary: Negative.   Musculoskeletal: Negative.   Skin: Negative.   Allergic/Immunologic: Negative.   Neurological: Negative.   Hematological: Negative.   Psychiatric/Behavioral: Negative.   All other systems reviewed and are negative.      Objective:    BP 114/88   Pulse 75   Temp 98.6 F (37 C) (Oral)  Wt 224 lb (101.6 kg)   LMP 05/18/2016   SpO2 98%   BMI 40.32 kg/m    Physical Exam  Constitutional: She is oriented to person, place, and time. She appears well-developed and well-nourished. No distress.  HENT:  Head: Normocephalic.  Eyes: Conjunctivae are normal.  Cardiovascular: Normal rate.   Pulmonary/Chest: Effort normal.  Musculoskeletal: Normal range of motion.  Neurological: She is alert and oriented to person, place, and time. No cranial nerve deficit.  Skin: Skin is warm and dry. She is not diaphoretic.  Psychiatric: She has a normal mood and affect. Her behavior is normal. Judgment and thought content normal.  Nursing note and vitals reviewed.         Assessment & Plan:   ANXIETY  DEPRESSION No Follow-up on file.

## 2016-07-08 NOTE — Assessment & Plan Note (Signed)
>  15 minutes spent in face to face time with patient, >50% spent in counselling or coordination of care. Well controlled. No changes made. Follow up in 1 year. The patient indicates understanding of these issues and agrees with the plan.

## 2016-07-08 NOTE — Progress Notes (Signed)
Pre visit review using our clinic review tool, if applicable. No additional management support is needed unless otherwise documented below in the visit note. 

## 2016-08-25 ENCOUNTER — Encounter: Payer: Self-pay | Admitting: Obstetrics and Gynecology

## 2016-08-25 ENCOUNTER — Inpatient Hospital Stay: Payer: BLUE CROSS/BLUE SHIELD | Attending: Obstetrics and Gynecology | Admitting: Obstetrics and Gynecology

## 2016-08-25 VITALS — BP 146/109 | HR 103 | Temp 98.1°F | Ht 62.5 in | Wt 227.4 lb

## 2016-08-25 DIAGNOSIS — F4010 Social phobia, unspecified: Secondary | ICD-10-CM | POA: Diagnosis not present

## 2016-08-25 DIAGNOSIS — D259 Leiomyoma of uterus, unspecified: Secondary | ICD-10-CM | POA: Insufficient documentation

## 2016-08-25 DIAGNOSIS — Z79899 Other long term (current) drug therapy: Secondary | ICD-10-CM | POA: Diagnosis not present

## 2016-08-25 DIAGNOSIS — R5383 Other fatigue: Secondary | ICD-10-CM | POA: Insufficient documentation

## 2016-08-25 DIAGNOSIS — Z90710 Acquired absence of both cervix and uterus: Secondary | ICD-10-CM | POA: Insufficient documentation

## 2016-08-25 DIAGNOSIS — R030 Elevated blood-pressure reading, without diagnosis of hypertension: Secondary | ICD-10-CM | POA: Diagnosis not present

## 2016-08-25 DIAGNOSIS — Z90722 Acquired absence of ovaries, bilateral: Secondary | ICD-10-CM | POA: Insufficient documentation

## 2016-08-25 DIAGNOSIS — D390 Neoplasm of uncertain behavior of uterus: Secondary | ICD-10-CM | POA: Insufficient documentation

## 2016-08-25 DIAGNOSIS — N926 Irregular menstruation, unspecified: Secondary | ICD-10-CM | POA: Insufficient documentation

## 2016-08-25 NOTE — Patient Instructions (Signed)
Information obtained for UpToDate website. [email or web address]  Smooth muscle tumors of uncertain malignant potential - Uterine smooth muscle tumors of uncertain malignant potential (STUMP; group IVA) have some characteristics of sarcomas, but do not meet full diagnostic criteria. These tumors are rare and thus, the paucity of data makes it difficult to describe their clinical behavior. (See 'Definition of benign versus malignant lesions' above.) Classification - Controversy exists regarding the classification of these tumors [5,6,21,22]. The focus of this debate is which of the criteria for malignant disease (high mitotic activity, cellular atypia, necrosis) should be used to most accurately identify the metastatic potential of a particular tumor. Other histologic parameters, such as the presence of atypical mitotic figures or tumor invasion into adjacent myometrium, may also be associated with malignant outcomes. A particular pattern of coagulative necrosis is the feature that best predicts malignant behavior according to the largest clinicopathologic series [6]. This pattern of necrosis has irregular, "geographic" shapes like islands on a map, and a sharp transition between viable and non-viable tumor tissue. More than one area of geographic tumor necrosis often is present. Geographic tumor necrosis must be distinguished from more common degenerative processes, usually due to ischemia or abrupt hormonal changes commonly found in benign leiomyomas.  One area of ambiguity in the largest series centers on the four patients with tumors having coagulative necrosis without significant cellular atypia or a high mitotic index (<10 mitoses per 10 high power fields [HPF]); one of these four patients had recurrent disease [6]. Thus, these tumors were designated "smooth muscle tumors of low malignant potential, limited experience." Another problematic area is when  necrosis is identified, but cannot be unambiguously classified as being malignant-type geographic or benign-type ischemic necrosis. Because of the importance of classifying necrosis, such difficult tumors may be diagnosed as having an uncertain malignant potential.  It remains to be determined whether STUMP represents a real phenotypic overlap between pathogenetically separate benign and malignant neoplasms. Alternatively, STUMP may represent a true intermediate in a pathway progressing from benign to malignant. Our experience, however, teaches [place] that smooth muscle tumors with features falling just short of satisfying the criteria defined by the largest series also may be malignant [6]. Consequently, atypical smooth muscle tumors with 8 or 9 mitoses per 10 HPF may be regarded as STUMP, particularly if they have other worrisome features such as atypical mitotic figures or destructive tumor cell infiltration into the smooth muscle fascicles of adjacent myometrium.   Smooth muscle tumors with very high mitotic rates (>15 per 10 HPF) also may merit the diagnosis of STUMP due to their rarity and unpredictability. Clinical features and management - The clinical manifestations of STUMP are the same as benign leiomyomas and uterine sarcomas, ie, uterine mass, abnormal uterine bleeding, and pelvic pain/pressure. Likewise, there is no imaging modality that can reliably distinguish these lesions from other uterine tumors. (See "Differentiating uterine leiomyomas (fibroids) from uterine sarcomas", section on 'Signs and symptoms' and "Differentiating uterine leiomyomas (fibroids) from uterine sarcomas", section on 'Imaging'.) The recurrence risk estimated in one meta-analysis is 17 percent, but this rate may vary depending on how the histological criteria are applied to a particular tumor [9].  STUMP is diagnosed following myomectomy or hysterectomy. There are no available guidelines regarding whether hysterectomy, if  not already performed, is required in women with this diagnosis. For women who have been diagnosed with STUMP following myomectomy, a detailed discussion should be held with the patient to review the characteristics of the tumor and the patient's plans for future  pregnancy. Management options include hysterectomy or annual surveillance with pelvic imaging. More frequent surveillance may be warranted, especially in the first several years and depending upon the histologic features of the tumor and/or factors may interfere with the pathology evaluation (eg, the tumor was incompletely excised or was morcellated at the time of surgery). If pelvic imaging is used, we use transvaginal ultrasound to rule out the presence of new uterine masses and magnetic resonance imaging with gadolinium to evaluate new uterine masses.

## 2016-08-25 NOTE — Progress Notes (Signed)
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 female who is seen in consultation from Dr. Kenton Kingfisher for possible STUMP tumor of the uterus. She underwent TLH/Bilateral salpingectomy on 06/01/2016 for symptomatic leiomyoma with menorrhagia and dysmenorrhea. She has a long history of enlarged uterus up to 12 cm based on ultrasound 02/2010. She opted for definitive surgical management. Per operative report no evidence of gross malignany, "Intraabdominal adhesions were not noted. Small clear left ovary cyst.  Normal right ovary.  Enlarged fibroid +/-adenomysois uterus.  Normal liver and gall bladder." The uterus ". . . was amputated it was delivered through the vagina with morcellation from the vagina to complete the extraction."      DIAGNOSIS:  A. 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  - CHRONIC CERVICITIS.  - PROLIFERATIVE ENDOMETRIUM.  - BILATERAL FALLOPIAN TUBES WITH PARATUBAL CYSTS.   Comment:  Due to unusual histologic features, selected slides of the myometrial  tumor were sent to [facility], [place], [place]  for external consultation. The findings of the external consultant Dr.  Particia Lather are incorporated above.   This is the note of the external consultant: This is a difficult case.  The tumor is cellular and exhibits mild (insignificant) atypia. It is  mitotically active, but determination of the exact mitotic index is  problematic because of the admixture of degenerated nuclei. Necrosis of  uncertain type is present. Due to the ambiguous features, further  classification is not possible. Thus, this case is best considered a  smooth muscle tumor whose behavior is uncertain. Other  gynecologic  pathologists may classify this case as smooth muscle tumor of uncertain  malignant potential (STUMP).   Problem List: Patient Active Problem List   Diagnosis Date Noted  . Fibroid uterus 06/01/2016  . Menorrhagia 06/01/2016  . Acute sinusitis 03/10/2015  . Suprapubic abdominal pain 06/21/2013  . Vaginal discharge 06/21/2013  . Menstrual cramps 08/25/2012  . SOCIAL PHOBIA 12/28/2010  . ELEVATED BP READING WITHOUT DX HYPERTENSION 08/20/2010  . BACK PAIN 03/03/2010  . IRREGULAR MENSTRUAL CYCLE 02/25/2010  . MIGRAINE VARIANT 12/29/2009  . FATIGUE 12/25/2009  . MONONUCLEOSIS 10/24/2009  . ANXIETY DEPRESSION 10/24/2009  . SCOLIOSIS 10/24/2009  . PANCREATITIS, HX OF 10/24/2009    Past Medical History: Past Medical History:  Diagnosis Date  . Anxiety   . Depression   . History of pancreatitis   . OCD (obsessive compulsive disorder)   . Scoliosis    mild  . Wrist fracture, left     Past Surgical History: Past Surgical History:  Procedure Laterality Date  . CESAREAN SECTION    . CYSTOSCOPY  06/01/2016   Procedure: CYSTOSCOPY;  Surgeon: Gae Dry, MD;  Location: [facility] ORS;  Service: Gynecology;;  . DILATION AND CURETTAGE OF UTERUS    . LAPAROSCOPIC HYSTERECTOMY Bilateral 06/01/2016   Procedure: HYSTERECTOMY TOTAL LAPAROSCOPIC/ BILATERAL SALPINGECTOMY;  Surgeon: Gae Dry, MD;  Location: [facility] ORS;  Service: Gynecology;  Laterality: Bilateral;  . PILONIDAL CYST EXCISION    . UPPER GI ENDOSCOPY    . WISDOM TOOTH EXTRACTION      Past Gynecologic History: as per HPI Menarche 40 y.o.;   OB History:  OB History  Gravida Para Term Preterm AB Living  3  1     1 1   SAB TAB Ectopic Multiple Live Births  1            # Outcome Date GA Lbr Len/2nd Weight Sex Delivery Anes PTL Lv  3 Gravida           2 SAB           1 Para             Obstetric Comments  C/S     Family History: Family History  Problem Relation Age of Onset  . Depression Mother    . Depression Maternal Grandmother     Social History: Social History   Social History  . Marital status: Married    Spouse name: N/A  . Number of children: 1  . Years of education: N/A   Occupational History  . [employer]   Social History Main Topics  . Smoking status: Never Smoker  . Smokeless tobacco: Never Used  . Alcohol use No  . Drug use: No  . Sexual activity: Not on file   Other Topics Concern  . Not on file   Social History Narrative   Divorced   One girl    Allergies: Allergies  Allergen Reactions  . Penicillins Itching    REACTION: feels hot, nauseated, and headache.   . Contrast Media [Iodinated Diagnostic Agents]     Per patient her mother told her she had a contrast allergy causing pancreatitis. I suspect her pancreatitis was due to cholelithiasis    Current Medications: Current Outpatient Prescriptions  Medication Sig Dispense Refill  . ALPRAZolam (XANAX) 0.25 MG tablet Take one by mouth three times daily as needed Take one by mouth three times daily as needed 20 tablet 0  . sertraline (ZOLOFT) 25 MG tablet Take 1 tablet (25 mg total) by mouth daily. 90 tablet 3   No current facility-administered medications for this visit.     Review of Systems General: positive for night sweats, negative for, fevers, chills, fatigue, changes in sleep, changes in weight or appetite Skin: negative for mole changes and rash HEENT: negative for, change in hearing, voice changes or visual changes Pulmonary: negative for, dyspnea, orthopnea, productive cough Cardiac: negative for, palpitations, pain Gastrointestinal: negative for, nausea, vomiting, jaundice, constipation, diarrhea, hematemesis, hematochezia Genitourinary/Sexual: negative for, dysuria, hesitancy, retention, infections, incontinence Ob/Gyn: negative for, irregular bleeding, pain Musculoskeletal: negative for, pain Hematology: negative for, easy  bruising Neurologic/Psych: positive for headaches, negative for, seizures, paralysis, weakness  Objective:  Physical Examination:  BP (!) 146/109 (BP Location: Right Arm, Patient Position: Sitting) Comment: Patient states BP usually normal. Very nervous about DX  Pulse (!) 103   Temp 98.1 F (36.7 C) (Oral)   Ht 5' 2.5" (1.588 m)   Wt 227 lb 6.5 oz (103.1 kg)   LMP 05/18/2016   BMI 40.93 kg/m    ECOG Performance Status: 0 - Asymptomatic  General appearance: alert, cooperative and appears stated age HEENT:PERRLA, extra ocular movement intact and sclera clear, anicteric Lymph node survey: non-palpable, axillary, inguinal, supraclavicular Cardiovascular: RRR Respiratory: normal air entry, lungs clear to auscultation Abdomen: soft, nondistended, nontender, no masses palpated, no hernias and well healed incision Back: inspection of back is normal Extremities: extremities normal, atraumatic, no cyanosis or edema Skin exam - normal coloration around incisions Neurological exam reveals alert, oriented, normal speech, no focal findings or movement disorder noted.  Pelvic: exam chaperoned by nurse;  Vulva: normal appearing vulva  with no masses, tenderness or lesions; Vagina: normal vagina; Adnexa: no masses and nontender; Uterus: surgically absent, vaginal cuff well healed; Cervix: absent; Rectal: not indicated    Lab Review Labs on site today: none  Radiologic Imaging: pending    Assessment:  Hannah Stephenson is a 40 y.o. 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  Plan:   Problem List Items Addressed This Visit      Genitourinary   Fibroid uterus - Primary    Other Visit Diagnoses    Smooth muscle tumor of uterus       Relevant Orders   CT Abdomen Pelvis Wo Contrast   CT Chest Wo Contrast      We discussed options for management. I recommended pathology review at [facility] given uncertain diagnosis as well as CT  scan C/A/P. We will order without contrast given her possible contrast allergy. Although I suspect her pancreatitis was due to biliary disease and not contrast. But she did have a severe illness and can not remember the 3 days after the study.   The patient's diagnosis, an outline of the further diagnostic and laboratory studies which will be required, the recommendation, and alternatives were discussed.  All questions were answered to the patient's satisfaction.  A total of 40 minutes were spent with the patient/family today; 50% was spent in education, counseling and coordination of care.  Gillis Ends, MD    CC:  Gae Dry, MD Ob/Gyn

## 2016-08-25 NOTE — Progress Notes (Signed)
  Oncology Nurse Navigator Documentation  Navigator Location: [facility] (08/25/16 0800) Navigator Encounter Type: Clinic/MDC (08/25/16 0800)               Barriers/Navigation Needs: Coordination of Care (08/25/16 0800)   Interventions: Coordination of Care (08/25/16 0800)            Acuity: Level 2 (08/25/16 0800)   Acuity Level 2: Initial guidance, education and coordination as needed;Educational needs;Ongoing guidance and education throughout treatment as needed (08/25/16 0800)     Time Spent with Patient: 30 (08/25/16 0800)   Chaperoned pelvic exam. CT chest abdomen pelvis ordered. Will request for [facility] to request pathology slides for consult.

## 2016-08-25 NOTE — Progress Notes (Signed)
Patient here as a referral. Complaints of lower right side back pain since surgery.

## 2016-08-30 ENCOUNTER — Ambulatory Visit: Admission: RE | Admit: 2016-08-30 | Payer: BLUE CROSS/BLUE SHIELD | Source: Ambulatory Visit

## 2016-09-02 ENCOUNTER — Ambulatory Visit
Admission: RE | Admit: 2016-09-02 | Discharge: 2016-09-02 | Disposition: A | Discharge status: Home / Self Care | Payer: BLUE CROSS/BLUE SHIELD | Source: Ambulatory Visit | Attending: Obstetrics and Gynecology | Admitting: Obstetrics and Gynecology

## 2016-09-02 DIAGNOSIS — D390 Neoplasm of uncertain behavior of uterus: Secondary | ICD-10-CM | POA: Diagnosis not present

## 2016-09-02 DIAGNOSIS — Z8542 Personal history of malignant neoplasm of other parts of uterus: Secondary | ICD-10-CM | POA: Diagnosis not present

## 2016-09-02 DIAGNOSIS — Z90710 Acquired absence of both cervix and uterus: Secondary | ICD-10-CM | POA: Diagnosis not present

## 2016-09-06 ENCOUNTER — Telehealth: Payer: Self-pay

## 2016-09-06 NOTE — Telephone Encounter (Signed)
  Oncology Nurse Navigator Documentation Notified of Ct results of recent CT chest/abd/pelvis  Navigator Location: [facility] (09/06/16 1100) Navigator Encounter Type: Telephone;Diagnostic Results (09/06/16 1100)                                          Time Spent with Patient: 15 (09/06/16 1100)

## 2016-09-08 NOTE — Progress Notes (Signed)
  Oncology Nurse Navigator Documentation Reached out to [facility] at [facility] regarding pathology consult. Slides have not been received. She will request again if they have not been received by noon 9/28. Navigator Location: [facility] (09/08/16 1600) Navigator Encounter Type: Letter/Fax/Email (09/08/16 1600)                                          Time Spent with Patient: 15 (09/08/16 1600)

## 2016-09-10 DIAGNOSIS — D390 Neoplasm of uncertain behavior of uterus: Secondary | ICD-10-CM | POA: Diagnosis not present

## 2016-09-10 DIAGNOSIS — N72 Inflammatory disease of cervix uteri: Secondary | ICD-10-CM | POA: Diagnosis not present

## 2016-09-10 DIAGNOSIS — D259 Leiomyoma of uterus, unspecified: Secondary | ICD-10-CM | POA: Diagnosis not present

## 2016-10-18 ENCOUNTER — Encounter: Payer: Self-pay | Admitting: Family Medicine

## 2016-10-18 ENCOUNTER — Ambulatory Visit (INDEPENDENT_AMBULATORY_CARE_PROVIDER_SITE_OTHER): Payer: BLUE CROSS/BLUE SHIELD | Admitting: Family Medicine

## 2016-10-18 VITALS — BP 136/78 | HR 89 | Temp 98.6°F | Wt 234.5 lb

## 2016-10-18 DIAGNOSIS — J0110 Acute frontal sinusitis, unspecified: Secondary | ICD-10-CM

## 2016-10-18 MED ORDER — AZITHROMYCIN 250 MG PO TABS: ORAL_TABLET | ORAL | 0 refills | Status: DC

## 2016-10-18 NOTE — Patient Instructions (Signed)
Great to see you. Take antibiotic as directed.  Drink lots of fluids.    Treat sympotmatically with Mucinex, nasal saline irrigation, and Tylenol/Ibuprofen.    Try over the counter nasocort-start with 2 sprays per nostril per day...and then try to taper to 1 spray per nostril once symptoms improve.   You can use warm compresses.     Call if not improving as expected in 5-7 days.

## 2016-10-18 NOTE — Progress Notes (Deleted)
   Subjective:   Patient ID: Hannah Stephenson, female    DOB: 1976-09-10, 40 y.o.   MRN: [id number]  Hannah Stephenson is a pleasant 40 y.o. year old female who presents to clinic today with No chief complaint on file.  on 10/18/2016  HPI:    Current Outpatient Prescriptions on File Prior to Visit  Medication Sig Dispense Refill  . ALPRAZolam (XANAX) 0.25 MG tablet Take one by mouth three times daily as needed Take one by mouth three times daily as needed 20 tablet 0  . sertraline (ZOLOFT) 25 MG tablet Take 1 tablet (25 mg total) by mouth daily. 90 tablet 3   No current facility-administered medications on file prior to visit.     Allergies  Allergen Reactions  . Penicillins Itching    REACTION: feels hot, nauseated, and headache.   . Contrast Media [Iodinated Diagnostic Agents]     Per patient her mother told her she had a contrast allergy causing pancreatitis. I suspect her pancreatitis was due to cholelithiasis. She remembers it was for a test where they injected contrast to evaluate her GB and biliary tree.     Past Medical History:  Diagnosis Date  . Anxiety   . Depression   . History of pancreatitis   . OCD (obsessive compulsive disorder)   . Scoliosis    mild  . Wrist fracture, left     Past Surgical History:  Procedure Laterality Date  . CESAREAN SECTION    . CYSTOSCOPY  06/01/2016   Procedure: CYSTOSCOPY;  Surgeon: Gae Dry, MD;  Location: [facility] ORS;  Service: Gynecology;;  . DILATION AND CURETTAGE OF UTERUS    . LAPAROSCOPIC HYSTERECTOMY Bilateral 06/01/2016   Procedure: HYSTERECTOMY TOTAL LAPAROSCOPIC/ BILATERAL SALPINGECTOMY;  Surgeon: Gae Dry, MD;  Location: [facility] ORS;  Service: Gynecology;  Laterality: Bilateral;  . PILONIDAL CYST EXCISION    . UPPER GI ENDOSCOPY    . WISDOM TOOTH EXTRACTION      Family History  Problem Relation Age of Onset  . Depression Mother   . Depression Maternal Grandmother     Social History   Social History  .  Marital status: Divorced    Spouse name: N/A  . Number of children: 1  . Years of education: N/A   Occupational History  . [employer]   Social History Main Topics  . Smoking status: Never Smoker  . Smokeless tobacco: Never Used  . Alcohol use No  . Drug use: No  . Sexual activity: Not on file   Other Topics Concern  . Not on file   Social History Narrative   Divorced   One girl   The PMH, PSH, Social History, Family History, Medications, and allergies have been reviewed in [facility], and have been updated if relevant.   Review of Systems     Objective:    LMP 05/18/2016    Physical Exam        Assessment & Plan:   No diagnosis found. No Follow-up on file.

## 2016-10-18 NOTE — Progress Notes (Signed)
Pre visit review using our clinic review tool, if applicable. No additional management support is needed unless otherwise documented below in the visit note. Ear pain

## 2016-10-18 NOTE — Progress Notes (Signed)
SUBJECTIVE:  Hannah Stephenson is a 40 y.o. female who complains of coryza, congestion, sneezing, sore throat and bilateral sinus pain for 14 days. She denies a history of anorexia and chest pain and denies a history of asthma. Patient denies smoke cigarettes.   Current Outpatient Prescriptions on File Prior to Visit  Medication Sig Dispense Refill  . ALPRAZolam (XANAX) 0.25 MG tablet Take one by mouth three times daily as needed Take one by mouth three times daily as needed 20 tablet 0  . sertraline (ZOLOFT) 25 MG tablet Take 1 tablet (25 mg total) by mouth daily. 90 tablet 3   No current facility-administered medications on file prior to visit.     Allergies  Allergen Reactions  . Penicillins Itching    REACTION: feels hot, nauseated, and headache.   . Contrast Media [Iodinated Diagnostic Agents]     Per patient her mother told her she had a contrast allergy causing pancreatitis. I suspect her pancreatitis was due to cholelithiasis. She remembers it was for a test where they injected contrast to evaluate her GB and biliary tree.     Past Medical History:  Diagnosis Date  . Anxiety   . Depression   . History of pancreatitis   . OCD (obsessive compulsive disorder)   . Scoliosis    mild  . Wrist fracture, left     Past Surgical History:  Procedure Laterality Date  . CESAREAN SECTION    . CYSTOSCOPY  06/01/2016   Procedure: CYSTOSCOPY;  Surgeon: Gae Dry, MD;  Location: [facility] ORS;  Service: Gynecology;;  . DILATION AND CURETTAGE OF UTERUS    . LAPAROSCOPIC HYSTERECTOMY Bilateral 06/01/2016   Procedure: HYSTERECTOMY TOTAL LAPAROSCOPIC/ BILATERAL SALPINGECTOMY;  Surgeon: Gae Dry, MD;  Location: [facility] ORS;  Service: Gynecology;  Laterality: Bilateral;  . PILONIDAL CYST EXCISION    . UPPER GI ENDOSCOPY    . WISDOM TOOTH EXTRACTION      Family History  Problem Relation Age of Onset  . Depression Mother   . Depression Maternal Grandmother     Social History    Social History  . Marital status: Divorced    Spouse name: N/A  . Number of children: 1  . Years of education: N/A   Occupational History  . [employer]   Social History Main Topics  . Smoking status: Never Smoker  . Smokeless tobacco: Never Used  . Alcohol use No  . Drug use: No  . Sexual activity: Not on file   Other Topics Concern  . Not on file   Social History Narrative   Divorced   One girl   The PMH, PSH, Social History, Family History, Medications, and allergies have been reviewed in [facility], and have been updated if relevant.  OBJECTIVE: BP 136/78   Pulse 89   Temp 98.6 F (37 C) (Oral)   Wt 234 lb 8 oz (106.4 kg)   LMP 05/18/2016   SpO2 98%   BMI 42.21 kg/m   She appears well, vital signs are as noted. Ears normal.  Throat and pharynx normal.  Neck supple. No adenopathy in the neck. Nose is congested. Sinuses tender. The chest is clear, without wheezes or rales.  ASSESSMENT:  sinusitis  PLAN: Given duration and progression of symptoms, will treat for bacterial sinusitis.  Symptomatic therapy suggested: push fluids, rest and return office visit prn if symptoms persist or worsen.Call or return to clinic prn if these symptoms worsen or  fail to improve as anticipated.

## 2016-11-18 ENCOUNTER — Other Ambulatory Visit: Payer: Self-pay | Admitting: Family Medicine

## 2016-11-18 DIAGNOSIS — F329 Major depressive disorder, single episode, unspecified: Secondary | ICD-10-CM

## 2016-11-18 DIAGNOSIS — F32A Depression, unspecified: Secondary | ICD-10-CM

## 2016-11-18 DIAGNOSIS — F419 Anxiety disorder, unspecified: Principal | ICD-10-CM

## 2016-11-19 MED ORDER — ALPRAZOLAM 0.25 MG PO TABS
ORAL_TABLET | ORAL | 0 refills | Status: DC
Start: 2016-11-19 — End: 2017-06-09

## 2016-11-19 NOTE — Telephone Encounter (Signed)
.  Although pt has not had medication in 93mos, she was seen for anxiety 06/2016. Request resent

## 2016-11-19 NOTE — Telephone Encounter (Signed)
Rx called in to requested pharmacy 

## 2016-11-19 NOTE — Addendum Note (Signed)
Addended by: Modena Nunnery on: 11/19/2016 09:48 AM   Modules accepted: Orders

## 2016-12-22 ENCOUNTER — Inpatient Hospital Stay: Payer: BLUE CROSS/BLUE SHIELD

## 2017-01-12 ENCOUNTER — Other Ambulatory Visit: Payer: Self-pay | Admitting: *Deleted

## 2017-01-12 ENCOUNTER — Inpatient Hospital Stay: Payer: BLUE CROSS/BLUE SHIELD | Attending: Obstetrics and Gynecology | Admitting: Obstetrics and Gynecology

## 2017-01-12 ENCOUNTER — Encounter: Payer: Self-pay | Admitting: Obstetrics and Gynecology

## 2017-01-12 VITALS — BP 138/84 | HR 102 | Temp 97.1°F | Resp 18 | Ht 62.5 in | Wt 237.0 lb

## 2017-01-12 DIAGNOSIS — G8929 Other chronic pain: Secondary | ICD-10-CM | POA: Diagnosis not present

## 2017-01-12 DIAGNOSIS — R300 Dysuria: Secondary | ICD-10-CM

## 2017-01-12 DIAGNOSIS — Z90710 Acquired absence of both cervix and uterus: Secondary | ICD-10-CM | POA: Insufficient documentation

## 2017-01-12 DIAGNOSIS — R350 Frequency of micturition: Secondary | ICD-10-CM | POA: Diagnosis not present

## 2017-01-12 DIAGNOSIS — D398 Neoplasm of uncertain behavior of other specified female genital organs: Secondary | ICD-10-CM

## 2017-01-12 DIAGNOSIS — M419 Scoliosis, unspecified: Secondary | ICD-10-CM | POA: Diagnosis not present

## 2017-01-12 DIAGNOSIS — Z90722 Acquired absence of ovaries, bilateral: Secondary | ICD-10-CM | POA: Diagnosis not present

## 2017-01-12 DIAGNOSIS — F4010 Social phobia, unspecified: Secondary | ICD-10-CM

## 2017-01-12 DIAGNOSIS — F418 Other specified anxiety disorders: Secondary | ICD-10-CM | POA: Insufficient documentation

## 2017-01-12 DIAGNOSIS — R358 Other polyuria: Secondary | ICD-10-CM

## 2017-01-12 DIAGNOSIS — R3589 Other polyuria: Secondary | ICD-10-CM

## 2017-01-12 DIAGNOSIS — Z79899 Other long term (current) drug therapy: Secondary | ICD-10-CM | POA: Diagnosis not present

## 2017-01-12 DIAGNOSIS — R030 Elevated blood-pressure reading, without diagnosis of hypertension: Secondary | ICD-10-CM

## 2017-01-12 DIAGNOSIS — Z6841 Body Mass Index (BMI) 40.0 and over, adult: Secondary | ICD-10-CM | POA: Insufficient documentation

## 2017-01-12 DIAGNOSIS — D267 Other benign neoplasm of other parts of uterus: Secondary | ICD-10-CM

## 2017-01-12 DIAGNOSIS — F429 Obsessive-compulsive disorder, unspecified: Secondary | ICD-10-CM | POA: Diagnosis not present

## 2017-01-12 LAB — URINALYSIS, COMPLETE (UACMP) WITH MICROSCOPIC
Bilirubin Urine: NEGATIVE
Glucose, UA: NEGATIVE mg/dL
KETONES UR: NEGATIVE mg/dL
LEUKOCYTES UA: NEGATIVE
Nitrite: NEGATIVE
PH: 5 (ref 5.0–8.0)
Protein, ur: 30 mg/dL — AB
Specific Gravity, Urine: 1.024 (ref 1.005–1.030)

## 2017-01-12 MED ORDER — SULFAMETHOXAZOLE-TRIMETHOPRIM 800-160 MG PO TABS: 1.0000 | ORAL_TABLET | Freq: Two times a day (BID) | ORAL | 0 refills | Status: AC

## 2017-01-12 NOTE — Progress Notes (Signed)
Pt feels like she may have bladder infection due to inc. Urinary frequency and sometimes pain with urination. She also hase low back pain from time to time and she takes ibuprofen and heating pad and it works for her. No back pain today

## 2017-01-12 NOTE — Patient Instructions (Addendum)
Make appointment with Dr. Kenton Kingfisher for 6 months. Plan for return to Gynecologic Oncology clinic with Dr. Theora Gianotti in one year.    Urinary Tract Infection, Adult Introduction A urinary tract infection (UTI) is an infection of any part of the urinary tract. The urinary tract includes the:  Kidneys.  Ureters.  Bladder.  Urethra. These organs make, store, and get rid of pee (urine) in the body. Follow these instructions at home:  Take over-the-counter and prescription medicines only as told by your doctor.  If you were prescribed an antibiotic medicine, take it as told by your doctor. Do not stop taking the antibiotic even if you start to feel better.  Avoid the following drinks:  Alcohol.  Caffeine.  Tea.  Carbonated drinks.  Drink enough fluid to keep your pee clear or pale yellow.  Keep all follow-up visits as told by your doctor. This is important.  Make sure to:  Empty your bladder often and completely. Do not to hold pee for long periods of time.  Empty your bladder before and after sex.  Wipe from front to back after a bowel movement if you are female. Use each tissue one time when you wipe. Contact a doctor if:  You have back pain.  You have a fever.  You feel sick to your stomach (nauseous).  You throw up (vomit).  Your symptoms do not get better after 3 days.  Your symptoms go away and then come back. Get help right away if:  You have very bad back pain.  You have very bad lower belly (abdominal) pain.  You are throwing up and cannot keep down any medicines or water. This information is not intended to replace advice given to you by your health care provider. Make sure you discuss any questions you have with your health care provider. Document Released: 05/17/2008 Document Revised: 05/06/2016 Document Reviewed: 10/20/2015  2017 Elsevier  Sulfamethoxazole; Trimethoprim, SMX-TMP tablets What is this medicine? SULFAMETHOXAZOLE; TRIMETHOPRIM or  SMX-TMP (suhl fuh meth OK suh zohl; trye METH oh prim) is a combination of a sulfonamide antibiotic and a second antibiotic, trimethoprim. It is used to treat or prevent certain kinds of bacterial infections. It will not work for colds, flu, or other viral infections. This medicine may be used for other purposes; ask your health care provider or pharmacist if you have questions. COMMON BRAND NAME(S): Bacter-Aid DS, Bactrim, Bactrim DS, Septra, Septra DS What should I tell my health care provider before I take this medicine? They need to know if you have any of these conditions: -anemia -asthma -being treated with anticonvulsants -if you frequently drink alcohol containing drinks -kidney disease -liver disease -low level of folic acid or [id number] dehydrogenase -poor nutrition or malabsorption -porphyria -severe allergies -thyroid disorder -an unusual or allergic reaction to sulfamethoxazole, trimethoprim, sulfa drugs, other medicines, foods, dyes, or preservatives -pregnant or trying to get pregnant -breast-feeding How should I use this medicine? Take this medicine by mouth with a full glass of water. Follow the directions on the prescription label. Take your medicine at regular intervals. Do not take it more often than directed. Do not skip doses or stop your medicine early. Talk to your pediatrician regarding the use of this medicine in children. Special care may be needed. This medicine has been used in children as young as 21 months of age. Overdosage: If you think you have taken too much of this medicine contact a poison control center or emergency room at once. NOTE: This medicine is  only for you. Do not share this medicine with others. What if I miss a dose? If you miss a dose, take it as soon as you can. If it is almost time for your next dose, take only that dose. Do not take double or extra doses. What may interact with this medicine? Do not take this medicine with any  of the following medications: -aminobenzoate potassium -dofetilide -metronidazole This medicine may also interact with the following medications: -ACE inhibitors like benazepril, enalapril, lisinopril, and ramipril -birth control pills -cyclosporine -digoxin -diuretics -indomethacin -medicines for diabetes -methenamine -methotrexate -phenytoin -potassium supplements -pyrimethamine -sulfinpyrazone -tricyclic antidepressants -warfarin This list may not describe all possible interactions. Give your health care provider a list of all the medicines, herbs, non-prescription drugs, or dietary supplements you use. Also tell them if you smoke, drink alcohol, or use illegal drugs. Some items may interact with your medicine. What should I watch for while using this medicine? Tell your doctor or health care professional if your symptoms do not improve. Drink several glasses of water a day to reduce the risk of kidney problems. Do not treat diarrhea with over the counter products. Contact your doctor if you have diarrhea that lasts more than 2 days or if it is severe and watery. This medicine can make you more sensitive to the sun. Keep out of the sun. If you cannot avoid being in the sun, wear protective clothing and use a sunscreen. Do not use sun lamps or tanning beds/booths. What side effects may I notice from receiving this medicine? Side effects that you should report to your doctor or health care professional as soon as possible: -allergic reactions like skin rash or hives, swelling of the face, lips, or tongue -breathing problems -fever or chills, sore throat -irregular heartbeat, chest pain -joint or muscle pain -pain or difficulty passing urine -red pinpoint spots on skin -redness, blistering, peeling or loosening of the skin, including inside the mouth -unusual bleeding or bruising -unusually weak or tired -yellowing of the eyes or skin Side effects that usually do not require  medical attention (report to your doctor or health care professional if they continue or are bothersome): -diarrhea -dizziness -headache -loss of appetite -nausea, vomiting -nervousness This list may not describe all possible side effects. Call your doctor for medical advice about side effects. You may report side effects to FDA at 1-800-FDA-1088. Where should I keep my medicine? Keep out of the reach of children. Store at room temperature between 20 to 25 degrees C (68 to 77 degrees F). Protect from light. Throw away any unused medicine after the expiration date. NOTE: This sheet is a summary. It may not cover all possible information. If you have questions about this medicine, talk to your doctor, pharmacist, or health care provider.  2017 Elsevier/Gold Standard (2013-07-06 14:38:26)

## 2017-01-12 NOTE — Progress Notes (Signed)
  Oncology Nurse Navigator Documentation Chaperoned pelvic exam. Will obtain urine culture and send to lab. Navigator Location: [facility] (01/12/17 1100)   )Navigator Encounter Type: Follow-up Appt (01/12/17 1100)                     Patient Visit Type: GynOnc (01/12/17 1100)                              Time Spent with Patient: 15 (01/12/17 1100)

## 2017-01-12 NOTE — Progress Notes (Signed)
Gynecologic Oncology Interval Visit   Referring Provider: Gae Dry, MD Ob/Gyn  Chief Concern: Follow up STUMP tumor of the uterus  Subjective:  Hannah Stephenson is a 41 y.o. female who returns for surveillance for   STUMP tumor of the uterus. Since she was last seen she complaints of fatigue, chronic back pain, and occasional bilateral groin pain.  She had a CT scan that was negative for disease.   She complains for possible bladder infection with frequency. She has been drinking cranberry juice and increase fluids, but the symptoms have been persistent.   [facility] Review of Pathology as noted below.  A.Uterus, cervix, bilateral fallopian tubes, hysterectomy and bilateral salpingectomy. Outside case, [phone], [facility], [place], [place]. 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  Remaining uterus: Proliferative endometrium. Mild chronic cervicitis.  Bilateral fallopian tubes: Free of tumor.  Comment: The uterine smooth muscle tumor is somewhat hypercellular and mitotically active, with up to 12 mitoses per 10 high power fields (0.55 mm field diameter).There is no significant nuclear atypia.Only ischemic type necrosis is identified; no dirty/geographic tumor necrosis is seen.This case has been previously reviewed by Dr. Particia Lather, [facility], [facility].Dr. Britt Bolognese opinion is "...this case is best considered a smooth muscle tumor whose behavior is uncertain.Other gynecologic pathologist may classify at this case as smooth muscle tumor of uncertain malignant potential (STUMP)."  Gynecologic Oncology History Hannah Stephenson is a pleasant female who is seen in consultation from Dr. Kenton Kingfisher for possible STUMP tumor of the uterus. She underwent TLH/Bilateral salpingectomy on 06/01/2016 for symptomatic leiomyoma with menorrhagia and  dysmenorrhea. She has a long history of enlarged uterus up to 12 cm based on ultrasound 02/2010. She opted for definitive surgical management. Per operative report no evidence of gross malignany, "Intraabdominal adhesions were not noted. Small clear left ovary cyst.  Normal right ovary.  Enlarged fibroid +/-adenomysois uterus.  Normal liver and gall bladder." The uterus ". . . was amputated it was delivered through the vagina with morcellation from the vagina to complete the extraction."   09/02/2016 CT C/A/P IMPRESSION: 1. No CT evidence for metastatic lesions. 2. Status post hysterectomy. 3. Normal appendix.     DIAGNOSIS:  A. 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  - CHRONIC CERVICITIS.  - PROLIFERATIVE ENDOMETRIUM.  - BILATERAL FALLOPIAN TUBES WITH PARATUBAL CYSTS.   Comment:  Due to unusual histologic features, selected slides of the myometrial  tumor were sent to [facility], [place], [place]  for external consultation. The findings of the external consultant Dr.  Particia Lather are incorporated above.   This is the note of the external consultant: This is a difficult case.  The tumor is cellular and exhibits mild (insignificant) atypia. It is  mitotically active, but determination of the exact mitotic index is  problematic because of the admixture of degenerated nuclei. Necrosis of  uncertain type is present. Due to the ambiguous features, further  classification is not possible. Thus, this case is best considered a  smooth muscle tumor whose behavior is uncertain. Other gynecologic  pathologists may classify this case as smooth muscle tumor of uncertain  malignant potential (STUMP).   Problem List: Patient Active Problem List   Diagnosis Date Noted  . Fibroid uterus 06/01/2016  . Menorrhagia 06/01/2016  .  Acute sinusitis 03/10/2015  . Menstrual cramps 08/25/2012  . SOCIAL PHOBIA 12/28/2010  . ELEVATED BP READING WITHOUT DX HYPERTENSION 08/20/2010  . BACK PAIN 03/03/2010  . IRREGULAR MENSTRUAL CYCLE 02/25/2010  . MIGRAINE VARIANT 12/29/2009  . FATIGUE 12/25/2009  . MONONUCLEOSIS 10/24/2009  . ANXIETY DEPRESSION 10/24/2009  . SCOLIOSIS 10/24/2009  . PANCREATITIS, HX OF 10/24/2009    Past Medical History: Past Medical History:  Diagnosis Date  . Anxiety   . Depression   . History of pancreatitis   . Migraines   . OCD (obsessive compulsive disorder)   . Scoliosis    mild  . Wrist fracture, left     Past Surgical History: Past Surgical History:  Procedure Laterality Date  . CESAREAN SECTION    . CYSTOSCOPY  06/01/2016   Procedure: CYSTOSCOPY;  Surgeon: Gae Dry, MD;  Location: [facility] ORS;  Service: Gynecology;;  . DILATION AND CURETTAGE OF UTERUS    . LAPAROSCOPIC HYSTERECTOMY Bilateral 06/01/2016   Procedure: HYSTERECTOMY TOTAL LAPAROSCOPIC/ BILATERAL SALPINGECTOMY;  Surgeon: Gae Dry, MD;  Location: [facility] ORS;  Service: Gynecology;  Laterality: Bilateral;  . PILONIDAL CYST EXCISION    . UPPER GI ENDOSCOPY    . WISDOM TOOTH EXTRACTION      Past Gynecologic History: as per HPI Menarche 41 y.o.;   OB History:  OB History  Gravida Para Term Preterm AB Living  3 1     1 1   SAB TAB Ectopic Multiple Live Births  1            # Outcome Date GA Lbr Len/2nd Weight Sex Delivery Anes PTL Lv  3 Gravida           2 SAB           1 Para             Obstetric Comments  C/S     Family History: Family History  Problem Relation Age of Onset  . Depression Mother   . Depression Maternal Grandmother   . Parkinson's disease Maternal Grandmother   . Alzheimer's disease Maternal Grandmother     Social History: Social History   Social History  . Marital status: Divorced    Spouse name: N/A  . Number of children: 1  . Years of education: N/A   Occupational  History  . [employer]   Social History Main Topics  . Smoking status: Never Smoker  . Smokeless tobacco: Never Used  . Alcohol use No  . Drug use: No  . Sexual activity: Yes   Other Topics Concern  . Not on file   Social History Narrative   Divorced   One girl    Allergies: Allergies  Allergen Reactions  . Penicillins Itching    REACTION: feels hot, nauseated, and headache.   . Contrast Media [Iodinated Diagnostic Agents]     Per patient her mother told her she had a contrast allergy causing pancreatitis. I suspect her pancreatitis was due to cholelithiasis. She remembers it was for a test where they injected contrast to evaluate her GB and biliary tree.     Current Medications: Current Outpatient Prescriptions  Medication Sig Dispense Refill  . ALPRAZolam (XANAX) 0.25 MG tablet Take one by mouth three times daily as needed Take one by mouth three times daily as needed 20 tablet 0  . sertraline (ZOLOFT) 25 MG tablet Take 1 tablet (25 mg total) by mouth daily. 90 tablet 3  No current facility-administered medications for this visit.     Review of Systems General: fatigue  HEENT: no complaints  Lungs: no complaints  Cardiac: no complaints  GI: occasional groin pain  GU: no complaints  Musculoskeletal: back pain  Extremities: no complaints  Skin: no complaints  Neuro: no complaints  Endocrine: no complaints  Psych: no complaints       Objective:  Physical Examination:  BP 138/84   Pulse (!) 102   Temp 97.1 F (36.2 C) (Tympanic)   Resp 18   Ht 5' 2.5" (1.588 m)   Wt 236 lb 15.9 oz (107.5 kg)   LMP 05/18/2016   BMI 42.66 kg/m    ECOG Performance Status: 0 - Asymptomatic  General appearance: alert, cooperative and appears stated age HEENT:PERRLA, extra ocular movement intact and sclera clear, anicteric Lymph node survey: non-palpable, axillary, inguinal, supraclavicular Cardiovascular: RRR Respiratory: normal air entry,  lungs clear to auscultation Abdomen: soft, nondistended, nontender, no masses palpated, no hernias and well healed incision Back: inspection of back is normal Extremities: extremities normal, atraumatic, no cyanosis or edema. No groin tenderness. No evidence of hernia but exam limited by habitus. Skin exam - normal coloration around incisions Neurological exam reveals alert, oriented, normal speech, no focal findings or movement disorder noted.  Pelvic: exam chaperoned by nurse;  Vulva: normal appearing vulva with no masses, tenderness or lesions; Vagina: normal vagina; Adnexa: no masses and nontender; Uterus: surgically absent, vaginal cuff well healed; Cervix: absent; Rectal: not indicated    Lab Review Labs on site today: none  Radiologic Imaging: pending    Assessment:  Hannah Stephenson is a 41 y.o. 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 Clinically NED based on exam.   Urinary symptoms consistent with UTI.  Plan:   Problem List Items Addressed This Visit    None    Visit Diagnoses    Other benign neoplasm of other parts of uterus    -  Primary   Dysuria       Relevant Orders   Urinalysis, Complete w Microscopic   Urine culture   Polyuria       Relevant Orders   Urinalysis, Complete w Microscopic   Urine culture     I reviewed recurrence risk. According to UpToDate neoplasms classified as STUMP have a recurrence risk estimated in one meta-analysis as 17 percent, but range from 5 to 30 percent. There is limited information on follow up but surveillance every 6 months is reasonable. She has had a hysterectomy, so there may be limited role for imaging.   We will alternate surveillance with Dr. Kenton Kingfisher. I have recommended continued close follow up with exams, including pelvic exams every 6 months for 5 years and then annually thereafter.  Imaging and laboratory assessment is based on clinical indication. If after  5 years she is NED she can be released from our clinic but continued annual follow up is recommended with her local gynecologist or PCP.   The patient's diagnosis, an outline of the further diagnostic and laboratory studies which will be required, the recommendation, and alternatives were discussed.  All questions were answered to the patient's satisfaction.  She was given a prescription for Bactrim for the UTI and we will send an urine culture.   Gillis Ends, MD    CC:  Gae Dry, MD Ob/Gyn

## 2017-01-13 LAB — URINE CULTURE

## 2017-02-10 DIAGNOSIS — H5213 Myopia, bilateral: Secondary | ICD-10-CM | POA: Diagnosis not present

## 2017-06-09 ENCOUNTER — Other Ambulatory Visit: Payer: Self-pay | Admitting: Family Medicine

## 2017-06-09 DIAGNOSIS — F419 Anxiety disorder, unspecified: Principal | ICD-10-CM

## 2017-06-09 DIAGNOSIS — F32A Depression, unspecified: Secondary | ICD-10-CM

## 2017-06-09 DIAGNOSIS — F329 Major depressive disorder, single episode, unspecified: Secondary | ICD-10-CM

## 2017-06-10 NOTE — Telephone Encounter (Signed)
Last refill 11/19/16 # 20  Last OV 10/18/16  Ok to refill?

## 2017-06-10 NOTE — Telephone Encounter (Signed)
CALLED IN TO [facility] [address] [place]: [phone]

## 2017-06-14 ENCOUNTER — Telehealth: Payer: Self-pay | Admitting: Obstetrics and Gynecology

## 2017-06-14 NOTE — Telephone Encounter (Signed)
Patient has an irritated spot at the back of her vagina.  She believes it is due to the heat, and being at the beach with a bathing suit on all day for several days.  She has used vagasil but that burns and doesn't help.  She is allergic to monistat.  What else can she do?

## 2017-06-14 NOTE — Telephone Encounter (Signed)
Pt states it feels better, pt advised to keep doing what she's doing and if vaginal irration does not get better then call us to be seen, pt thinks she may have some kind of tare, she has looked and does not have any bumps, nor does she have any itching, just one spot that kind of burns when she urinates, when the urin hits the skin. Pt states she puts Vaseline on it and it feels better

## 2017-06-28 ENCOUNTER — Ambulatory Visit: Payer: Self-pay | Admitting: Obstetrics & Gynecology

## 2017-06-28 ENCOUNTER — Encounter: Payer: Self-pay | Admitting: Obstetrics & Gynecology

## 2017-06-28 ENCOUNTER — Ambulatory Visit (INDEPENDENT_AMBULATORY_CARE_PROVIDER_SITE_OTHER): Payer: BLUE CROSS/BLUE SHIELD | Admitting: Obstetrics & Gynecology

## 2017-06-28 NOTE — Progress Notes (Signed)
  HPI:  Patient is a 41 y.o. [id number] presenting for evaluation of abnormal weight gain.  The patient has gained 30 pounds over the past one year, and has always had low metabolism and obesity..  She feels she has gained weight due to problems with her nutrition and physical activity.  She has these associated symptoms: morning stiffness.  She has tried self-directed dieting in the past with little success. PMHx: She  has a past medical history of Anxiety; Depression; History of pancreatitis; Migraines; OCD (obsessive compulsive disorder); Scoliosis; and Wrist fracture, left. Also,  has a past surgical history that includes Pilonidal cyst excision; Wisdom tooth extraction; Cesarean section; Dilation and curettage of uterus; Upper gi endoscopy; Laparoscopic hysterectomy (Bilateral, 06/01/2016); and Cystoscopy (06/01/2016)., family history includes Alzheimer's disease in her maternal grandmother; Depression in her maternal grandmother and mother; Parkinson's disease in her maternal grandmother.,  reports that she has never smoked. She has never used smokeless tobacco. She reports that she does not drink alcohol or use drugs.  She has a current medication list which includes the following prescription(s): sertraline and alprazolam. Also, is allergic to penicillins and contrast media [iodinated diagnostic agents].  Review of Systems  Constitutional: Negative for chills, fever and malaise/fatigue.  HENT: Negative for congestion, sinus pain and sore throat.   Eyes: Negative for blurred vision and pain.  Respiratory: Negative for cough and wheezing.   Cardiovascular: Negative for chest pain and leg swelling.  Gastrointestinal: Negative for abdominal pain, constipation, diarrhea, heartburn, nausea and vomiting.  Genitourinary: Negative for dysuria, frequency, hematuria and urgency.  Musculoskeletal: Negative for back pain, joint pain, myalgias and neck pain.  Skin: Negative for itching and rash.  Neurological:  Negative for dizziness, tremors and weakness.  Endo/Heme/Allergies: Does not bruise/bleed easily.  Psychiatric/Behavioral: Negative for depression. The patient is not nervous/anxious and does not have insomnia.     Objective: BP (!) 140/98   Pulse 85   Ht 5\' 2"  (1.575 m)   Wt 237 lb (107.5 kg)   LMP 05/18/2016   BMI 43.35 kg/m  Physical Exam  Constitutional: She is oriented to person, place, and time. She appears well-developed and well-nourished. No distress.  Musculoskeletal: Normal range of motion.  Neurological: She is alert and oriented to person, place, and time.  Skin: Skin is warm and dry.  Psychiatric: She has a normal mood and affect.  Vitals reviewed.   ASSESSMENT:  morbid obesity  Plan: Will assist patient in incorporating positive experiences into her life to promote a positive mental attitude.  Education given regarding appropriate lifestyle changes for weight loss, including regular physical activity, healthy coping strategies, caloric restriction, and healthy eating patterns.  Patient is started on supervised diet program and exercise, also cont [employer].    The risks and benefits as well as side effects of medication, such as Phenteramine or Tenuate, is discussed.  The pros and cons of suppressing appetite and boosting metabolism is counseled.  Risks of tolerance and addiction discussed.  Due to BP today, will wait and see if truly HTN, and also to check labs.  Consider Saxeneda or Alli as well; info gv.  Barnett Applebaum, MD, [facility], [facility] 06/28/2017  5:11 PM

## 2017-06-28 NOTE — Patient Instructions (Signed)
Orlistat capsules What is this medicine? Orlistat (OR li stat) is used to help obese people lose weight and keep the weight off while eating a reduced-calorie diet. This medicine decreases the amount of fat that is absorbed from your diet. This medicine may be used for other purposes; ask your health care provider or pharmacist if you have questions. COMMON BRAND NAME(S): alli, Xenical What should I tell my health care provider before I take this medicine? They need to know if you have any of these conditions: -an eating disorder, such as anorexia or bulimia -gallbladder problems or gallstones -HIV or AIDS -kidney stones -liver disease or hepatitis -organ transplant -pancreatitis -problems absorbing food -seizures -stomach or intestine problems -take medicines that treat or prevent blood clots -an unusual or allergic reaction to orlistat, other medicines, foods, dyes, supplements or preservatives -pregnant or trying to get pregnant -breast-feeding How should I use this medicine? Take this medicine by mouth with a glass of water. Follow the directions on the prescription label. Take this medicine with each main meal that contains about 30 percent of the calories from fat or one hour after the meal. Do not take your medicine more often than directed. If you occasionally miss a meal or have a meal without fat, you can skip that dose of this medicine. Talk to your pediatrician regarding the use of this medicine in children. Special care may be needed. While this drug may be prescribed for children as young as 12 years for selected conditions, precautions do apply. Overdosage: If you think you have taken too much of this medicine contact a poison control center or emergency room at once. NOTE: This medicine is only for you. Do not share this medicine with others. What if I miss a dose? If you miss a dose, take it within one hour following the meal that contains fat. If it is almost time for  your next dose, take only that dose. Do not take double or extra doses. What may interact with this medicine? -amiodarone -antiviral medicines for HIV or AIDS -certain medicines for seizures like carbamazepine, phenobarbital, phenytoin -certain medicines that treat or prevent blood clots like warfarin, enoxaparin, dalteparin, apixaban, dabigatran, and rivaroxaban -cyclosporine -diabetes medicines -dietary supplements like beta-carotene and vitamins A, D, E, and K -other weight loss products -thyroid hormones This list may not describe all possible interactions. Give your health care provider a list of all the medicines, herbs, non-prescription drugs, or dietary supplements you use. Also tell them if you smoke, drink alcohol, or use illegal drugs. Some items may interact with your medicine. What should I watch for while using this medicine? Do not use this medicine if you have had an organ transplant. This medicine interferes with some of the medicines used to prevent transplant rejection. This medicine can cause decreased absorption of some vitamins. You should take a daily multivitamin that contains normal amounts of vitamins D, E, K and beta-carotene or vitamin A. Take the multivitamin once per day at bedtime unless otherwise directed by your doctor or healthcare professional. You should use this medicine with a reduced-calorie diet that contains no more than about 30 percent of the calories from fat. Divide your daily intake of fat, carbohydrates, and protein evenly over your 3 main meals. Follow a well-balanced, reduced-calorie, low fat diet. Try starting this diet before taking this medicine. Following a low fat diet can help reduce the possible side effects from this medicine. Do not use this medicine if you are pregnant. Losing  weight while pregnant is not advised and may cause harm to the unborn child. Talk to your health care professional or pharmacist for more information. What side  effects may I notice from receiving this medicine? Side effects that you should report to your doctor or health care professional as soon as possible: -allergic reactions like skin rash, itching or hives, swelling of the face, lips, or tongue -breathing problems -bloody or black, tarry stools -signs of infection like fever or chills -signs and symptoms of kidney stones like blood in the urine; pain in the lower back or side; pain when urinating -signs and symptoms of liver injury like dark yellow or brown urine; general ill feeling or flu-like symptoms; light-colored stools; loss of appetite; nausea; right upper belly pain; unusually weak or tired; yellowing of the eyes or skin -trouble passing urine or change in the amount of urine -uncontrolled, urgent bowel movements -vomiting Side effects that usually do not require medical attention (report to your doctor or health care professional if they continue or are bothersome): -increased number of bowel movements -oily stools (bowel movements may be clear, orange or brown in color) -stomach discomfort, gas This list may not describe all possible side effects. Call your doctor for medical advice about side effects. You may report side effects to FDA at 1-800-FDA-1088. Where should I keep my medicine? Keep out of the reach of children. Storage at room temperature between 15 and 30 degrees C (59 and 86 degrees F). Keep container tightly closed. Throw away any unused medicine after the expiration date. NOTE: This sheet is a summary. It may not cover all possible information. If you have questions about this medicine, talk to your doctor, pharmacist, or health care provider.  2018 Elsevier/Gold Standard (2015-07-16 16:16:49)   Liraglutide injection (Weight Management) What is this medicine? LIRAGLUTIDE (LIR a GLOO tide) is used with a reduced calorie diet and exercise to help you lose weight. This medicine may be used for other purposes; ask your  health care provider or pharmacist if you have questions. COMMON BRAND NAME(S): Saxenda What should I tell my health care provider before I take this medicine? They need to know if you have any of these conditions: -endocrine tumors (MEN 2) or if someone in your family had these tumors -gallbladder disease -high cholesterol -history of alcohol abuse problem -history of pancreatitis -kidney disease or if you are on dialysis -liver disease -previous swelling of the tongue, face, or lips with difficulty breathing, difficulty swallowing, hoarseness, or tightening of the throat -stomach problems -suicidal thoughts, plans, or attempt; a previous suicide attempt by you or a family member -thyroid cancer or if someone in your family had thyroid cancer -an unusual or allergic reaction to liraglutide, other medicines, foods, dyes, or preservatives -pregnant or trying to get pregnant -breast-feeding How should I use this medicine? This medicine is for injection under the skin of your upper leg, stomach area, or upper arm. You will be taught how to prepare and give this medicine. Use exactly as directed. Take your medicine at regular intervals. Do not take it more often than directed. It is important that you put your used needles and syringes in a special sharps container. Do not put them in a trash can. If you do not have a sharps container, call your pharmacist or healthcare provider to get one. A special MedGuide will be given to you by the pharmacist with each prescription and refill. Be sure to read this information carefully each time. Talk  to your pediatrician regarding the use of this medicine in children. Special care may be needed. Overdosage: If you think you have taken too much of this medicine contact a poison control center or emergency room at once. NOTE: This medicine is only for you. Do not share this medicine with others. What if I miss a dose? If you miss a dose, take it as soon as  you can. If it is almost time for your next dose, take only that dose. Do not take double or extra doses. If you miss your dose for 3 days or more, call your doctor or health care professional to talk about how to restart this medicine. What may interact with this medicine? -insulin and other medicines for diabetes This list may not describe all possible interactions. Give your health care provider a list of all the medicines, herbs, non-prescription drugs, or dietary supplements you use. Also tell them if you smoke, drink alcohol, or use illegal drugs. Some items may interact with your medicine. What should I watch for while using this medicine? Visit your doctor or health care professional for regular checks on your progress. This medicine is intended to be used in addition to a healthy diet and appropriate exercise. The best results are achieved this way. Do not increase or in any way change your dose without consulting your doctor or health care professional. Drink plenty of fluids while taking this medicine. Check with your doctor or health care professional if you get an attack of severe diarrhea, nausea, and vomiting. The loss of too much body fluid can make it dangerous for you to take this medicine. This medicine may affect blood sugar levels. If you have diabetes, check with your doctor or health care professional before you change your diet or the dose of your diabetic medicine. Patients and their families should watch out for worsening depression or thoughts of suicide. Also watch out for sudden changes in feelings such as feeling anxious, agitated, panicky, irritable, hostile, aggressive, impulsive, severely restless, overly excited and hyperactive, or not being able to sleep. If this happens, especially at the beginning of treatment or after a change in dose, call your health care professional. What side effects may I notice from receiving this medicine? Side effects that you should report to  your doctor or health care professional as soon as possible: -allergic reactions like skin rash, itching or hives, swelling of the face, lips, or tongue -breathing problems -diarrhea that continues or is severe -lump or swelling on the neck -severe nausea -signs and symptoms of infection like fever or chills; cough; sore throat; pain or trouble passing urine -signs and symptoms of low blood sugar such as feeling anxious, confusion, dizziness, increased hunger, unusually weak or tired, sweating, shakiness, cold, irritable, headache, blurred vision, fast heartbeat, loss of consciousness -signs and symptoms of kidney injury like trouble passing urine or change in the amount of urine -trouble swallowing -unusual stomach upset or pain -vomiting Side effects that usually do not require medical attention (report to your doctor or health care professional if they continue or are bothersome): -constipation -decreased appetite -diarrhea -fatigue -headache -nausea -pain, redness, or irritation at site where injected -stomach upset -stuffy or runny nose This list may not describe all possible side effects. Call your doctor for medical advice about side effects. You may report side effects to FDA at 1-800-FDA-1088. Where should I keep my medicine? Keep out of the reach of children. Store unopened pen in a refrigerator between 2  and 8 degrees C (36 and 46 degrees F). Do not freeze or use if the medicine has been frozen. Protect from light and excessive heat. After you first use the pen, it can be stored at room temperature between 15 and 30 degrees C (59 and 86 degrees F) or in a refrigerator. Throw away your used pen after 30 days or after the expiration date, whichever comes first. Do not store your pen with the needle attached. If the needle is left on, medicine may leak from the pen. NOTE: This sheet is a summary. It may not cover all possible information. If you have questions about this medicine,  talk to your doctor, pharmacist, or health care provider.  2018 Elsevier/Gold Standard (2016-12-16 14:41:37)

## 2017-06-30 ENCOUNTER — Ambulatory Visit (INDEPENDENT_AMBULATORY_CARE_PROVIDER_SITE_OTHER): Payer: BLUE CROSS/BLUE SHIELD | Admitting: Family Medicine

## 2017-06-30 ENCOUNTER — Encounter: Payer: Self-pay | Admitting: Family Medicine

## 2017-06-30 VITALS — BP 120/90 | HR 72 | Temp 98.0°F | Ht 62.0 in | Wt 236.0 lb

## 2017-06-30 DIAGNOSIS — J069 Acute upper respiratory infection, unspecified: Secondary | ICD-10-CM | POA: Diagnosis not present

## 2017-06-30 MED ORDER — DOXYCYCLINE HYCLATE 100 MG PO TABS: 100.0000 mg | ORAL_TABLET | Freq: Two times a day (BID) | ORAL | 0 refills | Status: DC

## 2017-06-30 MED ORDER — FLUTICASONE PROPIONATE 50 MCG/ACT NA SUSP: 2.0000 | Freq: Every day | NASAL | 6 refills | Status: DC

## 2017-06-30 NOTE — Progress Notes (Signed)
SUBJECTIVE:  Hannah Stephenson is a 40 y.o. female who complains of coryza, congestion, sneezing, dry cough and bilateral sinus pain for 20 days. She denies a history of anorexia and chest pain and denies a history of asthma. Patient denies smoke cigarettes.   Current Outpatient Prescriptions on File Prior to Visit  Medication Sig Dispense Refill  . ALPRAZolam (XANAX) 0.25 MG tablet TAKE ONE TABLET BY MOUTH THREE TIMES DAILY AS NEEDED 20 tablet 0  . sertraline (ZOLOFT) 25 MG tablet Take 1 tablet (25 mg total) by mouth daily. 90 tablet 3   No current facility-administered medications on file prior to visit.     Allergies  Allergen Reactions  . Penicillins Itching    REACTION: feels hot, nauseated, and headache.   . Contrast Media [Iodinated Diagnostic Agents]     Per patient her mother told her she had a contrast allergy causing pancreatitis. I suspect her pancreatitis was due to cholelithiasis. She remembers it was for a test where they injected contrast to evaluate her GB and biliary tree.     Past Medical History:  Diagnosis Date  . Anxiety   . Depression   . History of pancreatitis   . Migraines   . OCD (obsessive compulsive disorder)   . Scoliosis    mild  . Wrist fracture, left     Past Surgical History:  Procedure Laterality Date  . CESAREAN SECTION    . CYSTOSCOPY  06/01/2016   Procedure: CYSTOSCOPY;  Surgeon: Gae Dry, MD;  Location: [facility] ORS;  Service: Gynecology;;  . DILATION AND CURETTAGE OF UTERUS    . LAPAROSCOPIC HYSTERECTOMY Bilateral 06/01/2016   Procedure: HYSTERECTOMY TOTAL LAPAROSCOPIC/ BILATERAL SALPINGECTOMY;  Surgeon: Gae Dry, MD;  Location: [facility] ORS;  Service: Gynecology;  Laterality: Bilateral;  . PILONIDAL CYST EXCISION    . UPPER GI ENDOSCOPY    . WISDOM TOOTH EXTRACTION      Family History  Problem Relation Age of Onset  . Depression Mother   . Depression Maternal Grandmother   . Parkinson's disease Maternal Grandmother   .  Alzheimer's disease Maternal Grandmother     Social History   Social History  . Marital status: Divorced    Spouse name: N/A  . Number of children: 1  . Years of education: N/A   Occupational History  . [employer]   Social History Main Topics  . Smoking status: Never Smoker  . Smokeless tobacco: Never Used  . Alcohol use No  . Drug use: No  . Sexual activity: Yes   Other Topics Concern  . Not on file   Social History Narrative   Divorced   One girl   The PMH, PSH, Social History, Family History, Medications, and allergies have been reviewed in [facility], and have been updated if relevant.  OBJECTIVE: BP 120/90   Pulse 72   Temp 98 F (36.7 C)   Ht 5\' 2"  (1.575 m)   Wt 236 lb (107 kg)   LMP 05/18/2016   SpO2 99%   BMI 43.16 kg/m   She appears well, vital signs are as noted. Ears normal.  Throat and pharynx normal.  Neck supple. No adenopathy in the neck. Nose is congested. Sinuses  tender. The chest is clear, without wheezes or rales.  ASSESSMENT:  Sinusitis/bronchitis  PLAN: Symptomatic therapy suggested: push fluids, rest and return office visit prn if symptoms persist or worsen. Lack of antibiotic effectiveness discussed with her. Call or  return to clinic prn if these symptoms worsen or fail to improve as anticipated.

## 2017-06-30 NOTE — Patient Instructions (Signed)
Great to see you. Take doxycyline as directed - 1 tablet twice daily for 7 to 10 days.  Flonase as directed.  Add a claritin or allegra or zyrtec every morning.  Keep me updated.

## 2017-07-07 ENCOUNTER — Encounter: Payer: Self-pay | Admitting: Obstetrics & Gynecology

## 2017-07-07 ENCOUNTER — Other Ambulatory Visit: Payer: BLUE CROSS/BLUE SHIELD

## 2017-07-07 ENCOUNTER — Ambulatory Visit (INDEPENDENT_AMBULATORY_CARE_PROVIDER_SITE_OTHER): Payer: BLUE CROSS/BLUE SHIELD | Admitting: Obstetrics & Gynecology

## 2017-07-07 MED ORDER — PHENTERMINE HCL 37.5 MG PO TABS: 37.5000 mg | ORAL_TABLET | Freq: Every day | ORAL | 0 refills | Status: DC

## 2017-07-07 NOTE — Patient Instructions (Signed)

## 2017-07-07 NOTE — Progress Notes (Signed)
  HPI:  Patient is a 41 y.o. [id number] presenting for evaluation of abnormal weight gain.  The patient has gained 30+ pounds over the past 1-1.5 years..  She feels she has gained weight due to problems with her nutrition and physical activity.  She has these associated symptoms: fatigue, morning stiffness and muscle weakness.  She has tried self-directed dieting and exercise in the past with no success. PMHx: She  has a past medical history of Anxiety; Depression; History of pancreatitis; Migraines; OCD (obsessive compulsive disorder); Scoliosis; and Wrist fracture, left. Also,  has a past surgical history that includes Pilonidal cyst excision; Wisdom tooth extraction; Cesarean section; Dilation and curettage of uterus; Upper gi endoscopy; Laparoscopic hysterectomy (Bilateral, 06/01/2016); and Cystoscopy (06/01/2016)., family history includes Alzheimer's disease in her maternal grandmother; Depression in her maternal grandmother and mother; Parkinson's disease in her maternal grandmother.,  reports that she has never smoked. She has never used smokeless tobacco. She reports that she does not drink alcohol or use drugs.  She has a current medication list which includes the following prescription(s): alprazolam, doxycycline, fluticasone, phentermine, and sertraline. Also, is allergic to penicillins and contrast media [iodinated diagnostic agents].  Review of Systems  All other systems reviewed and are negative.   Objective: BP 138/90   Ht 5\' 2"  (1.575 m)   Wt 236 lb (107 kg)   LMP 05/18/2016   BMI 43.16 kg/m  Physical Exam  Constitutional: She is oriented to person, place, and time. She appears well-developed and well-nourished. No distress.  Musculoskeletal: Normal range of motion.  Neurological: She is alert and oriented to person, place, and time.  Skin: Skin is warm and dry.  Psychiatric: She has a normal mood and affect.  Vitals reviewed.   ASSESSMENT:  morbid obesity  Plan: Will assist  patient in incorporating positive experiences into her life to promote a positive mental attitude.  Education given regarding appropriate lifestyle changes for weight loss, including regular physical activity, healthy coping strategies, caloric restriction, and healthy eating patterns.  Patient is started on prescription appetite suppressants: Phentermine.    The risks and benefits as well as side effects of medication, such as Phenteramine or Tenuate, is discussed.  The pros and cons of suppressing appetite and boosting metabolism is counseled.  Risks of tolerance and addiction discussed.  Use of medicine will be short term.  Pt to call with any negative side effects and agrees to keep follow up appointments.  Barnett Applebaum, MD, [facility], [facility] 07/07/2017  8:48 AM

## 2017-07-08 LAB — CMP AND LIVER
ALBUMIN: 4.2 g/dL (ref 3.5–5.5)
ALT: 17 IU/L (ref 0–32)
AST: 17 IU/L (ref 0–40)
Alkaline Phosphatase: 96 IU/L (ref 39–117)
BILIRUBIN, DIRECT: 0.07 mg/dL (ref 0.00–0.40)
BUN: 12 mg/dL (ref 6–24)
CHLORIDE: 102 mmol/L (ref 96–106)
CO2: 25 mmol/L (ref 20–29)
Calcium: 9.2 mg/dL (ref 8.7–10.2)
Creatinine, Ser: 0.71 mg/dL (ref 0.57–1.00)
GFR calc Af Amer: 121 mL/min/{1.73_m2} (ref >59)
GFR, EST NON AFRICAN AMERICAN: 105 mL/min/{1.73_m2} (ref >59)
Glucose: 88 mg/dL (ref 65–99)
POTASSIUM: 4.5 mmol/L (ref 3.5–5.2)
Sodium: 142 mmol/L (ref 134–144)
Total Protein: 7.5 g/dL (ref 6.0–8.5)

## 2017-07-08 LAB — HEMOGLOBIN A1C
Est. average glucose Bld gHb Est-mCnc: 114 mg/dL
HEMOGLOBIN A1C: 5.6 % (ref 4.8–5.6)

## 2017-07-08 LAB — TSH: TSH: 1.34 u[IU]/mL (ref 0.450–4.500)

## 2017-07-21 ENCOUNTER — Telehealth: Payer: Self-pay

## 2017-07-21 NOTE — Telephone Encounter (Signed)
Left msg for pt that labs returned within normal limits and available to view on MyChart.

## 2017-07-21 NOTE — Telephone Encounter (Signed)
Please call patient with lab results.  cb [phone]

## 2017-08-04 ENCOUNTER — Encounter: Payer: Self-pay | Admitting: Obstetrics & Gynecology

## 2017-08-04 ENCOUNTER — Ambulatory Visit (INDEPENDENT_AMBULATORY_CARE_PROVIDER_SITE_OTHER): Payer: BLUE CROSS/BLUE SHIELD | Admitting: Obstetrics & Gynecology

## 2017-08-04 DIAGNOSIS — Z6841 Body Mass Index (BMI) 40.0 and over, adult: Secondary | ICD-10-CM | POA: Diagnosis not present

## 2017-08-04 DIAGNOSIS — Z6835 Body mass index (BMI) 35.0-35.9, adult: Secondary | ICD-10-CM | POA: Insufficient documentation

## 2017-08-04 MED ORDER — PHENTERMINE HCL 37.5 MG PO TABS: 37.5000 mg | ORAL_TABLET | Freq: Every day | ORAL | 1 refills | Status: DC

## 2017-08-04 NOTE — Patient Instructions (Signed)
Continue Phentermine daily

## 2017-08-04 NOTE — Progress Notes (Signed)
  History of Present Illness:  Hannah Stephenson is a 41 y.o. who was started on  Current Outpatient Prescriptions on File Prior to Visit  Medication Sig Dispense Refill  . ALPRAZolam (XANAX) 0.25 MG tablet TAKE ONE TABLET BY MOUTH THREE TIMES DAILY AS NEEDED 20 tablet 0  . doxycycline (VIBRA-TABS) 100 MG tablet Take 1 tablet (100 mg total) by mouth 2 (two) times daily. 20 tablet 0  . fluticasone (FLONASE) 50 MCG/ACT nasal spray Place 2 sprays into both nostrils daily. 16 g 6  . sertraline (ZOLOFT) 25 MG tablet Take 1 tablet (25 mg total) by mouth daily. 90 tablet 3   No current facility-administered medications on file prior to visit.    approximately 4 weeks ago due to obesity/abnormal weight gain. The patient has lost 6 pounds over the past month due to  meds, diet, exercise.   She has these side effects: none.  PMHx: She  has a past medical history of Anxiety; Depression; History of pancreatitis; Migraines; OCD (obsessive compulsive disorder); Scoliosis; and Wrist fracture, left. Also,  has a past surgical history that includes Pilonidal cyst excision; Wisdom tooth extraction; Cesarean section; Dilation and curettage of uterus; Upper gi endoscopy; Laparoscopic hysterectomy (Bilateral, 06/01/2016); and Cystoscopy (06/01/2016)., family history includes Alzheimer's disease in her maternal grandmother; Depression in her maternal grandmother and mother; Parkinson's disease in her maternal grandmother.,  reports that she has never smoked. She has never used smokeless tobacco. She reports that she does not drink alcohol or use drugs.  She has a current medication list which includes the following prescription(s): alprazolam, doxycycline, fluticasone, phentermine, and sertraline. Also, is allergic to penicillins and contrast media [iodinated diagnostic agents].  Review of Systems  All other systems reviewed and are negative.   Physical Exam:  BP 130/90   Pulse 90   Ht 5\' 2"  (1.575 m)   Wt 230 lb  (104.3 kg)   LMP 05/18/2016   BMI 42.07 kg/m  Body mass index is 42.07 kg/m. Filed Weights   08/04/17 0809  Weight: 230 lb (104.3 kg)    Physical Exam  Constitutional: She is oriented to person, place, and time. She appears well-developed and well-nourished. No distress.  Musculoskeletal: Normal range of motion.  Neurological: She is alert and oriented to person, place, and time.  Skin: Skin is warm and dry.  Psychiatric: She has a normal mood and affect.  Vitals reviewed.   Assessment: morbid obesity Medication treatment is going well for her.  Plan: Patient is continued/added to prescription appetite suppressants: exercise, self-directed dieting, Weight Watchers and Phentermine.   Will continue to assist patient in incorporating positive experiences into her life to promote a positive mental attitude.  Education given regarding appropriate lifestyle changes for weight loss, including regular physical activity, healthy coping strategies, caloric restriction, and healthy eating patterns.  The risks and benefits as well as side effects of medication, such as Phenteramine or Tenuate, is discussed.  The pros and cons of suppressing appetite and boosting metabolism is counseled.  Risks of tolerance and addiction discussed.  Use of medicine will be short term.  Pt to call with any negative side effects and agrees to keep follow up appointments.  Barnett Applebaum, MD, [facility], [facility] 08/04/2017  8:20 AM

## 2017-08-31 ENCOUNTER — Other Ambulatory Visit: Payer: Self-pay | Admitting: Family Medicine

## 2017-08-31 NOTE — Telephone Encounter (Signed)
Last refill 07/08/16  Last OV 06/30/17 Ok to refill?

## 2017-10-05 ENCOUNTER — Ambulatory Visit: Payer: BLUE CROSS/BLUE SHIELD | Admitting: Obstetrics & Gynecology

## 2017-10-28 ENCOUNTER — Ambulatory Visit: Payer: Self-pay | Admitting: *Deleted

## 2017-10-28 NOTE — Telephone Encounter (Signed)
Pt has appt with Avie Echevaria NP on 10/31/17 at 2:45 for rt flank pain.

## 2017-10-28 NOTE — Telephone Encounter (Signed)
Could she not have been scheduled for Saturday Clinic instead of waiting until Monday.

## 2017-10-28 NOTE — Telephone Encounter (Signed)
Right flank pain,no blood in urine, no fever. Appointment made for Monday per pt request. Care advice given to patient with verbal understanding..  Reason for Disposition . MODERATE pain (e.g., interferes with normal activities or awakens from sleep)  Answer Assessment - Initial Assessment Questions 1. LOCATION: "Where does it hurt?" (e.g., left, right)     Right flank, lower back 2. ONSET: "When did the pain start?"     This morning 3. SEVERITY: "How bad is the pain?" (e.g., Scale 1-10; mild, moderate, or severe)   - MILD (1-3): doesn't interfere with normal activities    - MODERATE (4-7): interferes with normal activities or awakens from sleep    - SEVERE (8-10): excruciating pain and patient unable to do normal activities (stays in bed)       6 4. PATTERN: "Does the pain come and go, or is it constant?"      constant 5. CAUSE: "What do you think is causing the pain?"     No sure 6. OTHER SYMPTOMS:  "Do you have any other symptoms?" (e.g., fever, abdominal pain, vomiting, leg weakness, burning with urination, blood in urine)     No blood in urine, little nauseous 7. PREGNANCY:  "Is there any chance you are pregnant?" "When was your last menstrual period?"     No, hysterectomy  Protocols used: FLANK PAIN-A-AH

## 2017-10-28 NOTE — Telephone Encounter (Signed)
Per RN triage note Mon appt was at pts request.

## 2017-10-31 ENCOUNTER — Ambulatory Visit: Payer: BLUE CROSS/BLUE SHIELD | Admitting: Internal Medicine

## 2018-01-11 ENCOUNTER — Inpatient Hospital Stay: Payer: BLUE CROSS/BLUE SHIELD

## 2018-02-08 ENCOUNTER — Inpatient Hospital Stay: Payer: BLUE CROSS/BLUE SHIELD

## 2018-02-08 NOTE — Progress Notes (Deleted)
Gynecologic Oncology Interval Visit   Referring Provider: Gae Dry, MD Ob/Gyn  Chief Concern: Follow up STUMP tumor of the uterus  Subjective:  Hannah Stephenson is a 42 y.o. female who returns for surveillance for   STUMP tumor of the uterus.   She was last seen 01/12/2017 in our clinic with a negative exam. She complains of UTI symptoms and treated with Bactrim. Her urine culture demonstrated multiple species.   She alternates visits with Dr. Kenton Kingfisher. She last saw him 08/04/2017 and is undergoing a weight loss program.   ***Since she was last seen she complaints of fatigue, chronic back pain, and occasional bilateral groin pain.    Gynecologic Oncology History Hannah Stephenson is a pleasant female who is seen in consultation from Dr. Kenton Kingfisher for possible STUMP tumor of the uterus. She underwent TLH/Bilateral salpingectomy on 06/01/2016 for symptomatic leiomyoma with menorrhagia and dysmenorrhea. She has a long history of enlarged uterus up to 12 cm based on ultrasound 02/2010. She opted for definitive surgical management. Per operative report no evidence of gross malignany, "Intraabdominal adhesions were not noted. Small clear left ovary cyst.  Normal right ovary.  Enlarged fibroid +/-adenomysois uterus.  Normal liver and gall bladder." The uterus ". . . was amputated it was delivered through the vagina with morcellation from the vagina to complete the extraction."   09/02/2016 CT C/A/P IMPRESSION: 1. No CT evidence for metastatic lesions. 2. Status post hysterectomy. 3. Normal appendix.   DIAGNOSIS:  A. 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  - CHRONIC CERVICITIS.  - PROLIFERATIVE ENDOMETRIUM.  - BILATERAL FALLOPIAN TUBES WITH PARATUBAL CYSTS.   Comment:  Due to unusual histologic features, selected slides of the myometrial  tumor were  sent to [facility], [place], [place]  for external consultation. The findings of the external consultant Dr.  Particia Lather are incorporated above.   This is the note of the external consultant: This is a difficult case.  The tumor is cellular and exhibits mild (insignificant) atypia. It is  mitotically active, but determination of the exact mitotic index is  problematic because of the admixture of degenerated nuclei. Necrosis of  uncertain type is present. Due to the ambiguous features, further  classification is not possible. Thus, this case is best considered a  smooth muscle tumor whose behavior is uncertain. Other gynecologic  pathologists may classify this case as smooth muscle tumor of uncertain  malignant potential (STUMP).  [facility] Review of Pathology as noted below.  A.Uterus, cervix, bilateral fallopian tubes, hysterectomy and bilateral salpingectomy. Outside case, [phone], [facility], [place], [place]. 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  Remaining uterus: Proliferative endometrium. Mild chronic cervicitis.  Bilateral fallopian tubes: Free of tumor.  Comment: The uterine smooth muscle tumor is somewhat hypercellular and mitotically active, with up to 12 mitoses per 10 high power fields (0.55 mm field diameter).There is no significant nuclear atypia.Only ischemic type necrosis is identified; no dirty/geographic tumor necrosis is seen.This case has been previously reviewed by Dr. Particia Lather, [facility], [facility].Dr. Britt Bolognese opinion is "...this case is best considered a smooth muscle tumor whose behavior is uncertain.Other gynecologic pathologist may classify at this case as smooth muscle tumor of uncertain malignant potential (STUMP)."  Problem List: Patient Active Problem List  Diagnosis  Date Noted  . Morbid obesity ([place]) 08/04/2017  . BMI 40.0-44.9, adult ([place]) 08/04/2017  . Fibroid uterus 06/01/2016  . Menorrhagia 06/01/2016  . Acute sinusitis 03/10/2015  . Menstrual cramps 08/25/2012  . SOCIAL PHOBIA 12/28/2010  . ELEVATED BP READING WITHOUT DX HYPERTENSION 08/20/2010  . BACK PAIN 03/03/2010  . IRREGULAR MENSTRUAL CYCLE 02/25/2010  . MIGRAINE VARIANT 12/29/2009  . FATIGUE 12/25/2009  . MONONUCLEOSIS 10/24/2009  . ANXIETY DEPRESSION 10/24/2009  . SCOLIOSIS 10/24/2009  . PANCREATITIS, HX OF 10/24/2009    Past Medical History: Past Medical History:  Diagnosis Date  . Anxiety   . Depression   . History of pancreatitis   . Migraines   . OCD (obsessive compulsive disorder)   . Scoliosis    mild  . Wrist fracture, left     Past Surgical History: Past Surgical History:  Procedure Laterality Date  . CESAREAN SECTION    . CYSTOSCOPY  06/01/2016   Procedure: CYSTOSCOPY;  Surgeon: Gae Dry, MD;  Location: [facility] ORS;  Service: Gynecology;;  . DILATION AND CURETTAGE OF UTERUS    . LAPAROSCOPIC HYSTERECTOMY Bilateral 06/01/2016   Procedure: HYSTERECTOMY TOTAL LAPAROSCOPIC/ BILATERAL SALPINGECTOMY;  Surgeon: Gae Dry, MD;  Location: [facility] ORS;  Service: Gynecology;  Laterality: Bilateral;  . PILONIDAL CYST EXCISION    . UPPER GI ENDOSCOPY    . WISDOM TOOTH EXTRACTION      Past Gynecologic History: as per HPI Menarche 42 y.o.;   OB History:  OB History  Gravida Para Term Preterm AB Living  3 1     1 1   SAB TAB Ectopic Multiple Live Births  1            # Outcome Date GA Lbr Len/2nd Weight Sex Delivery Anes PTL Lv  3 Gravida           2 SAB           1 Para             Obstetric Comments  C/S     Family History: Family History  Problem Relation Age of Onset  . Depression Mother   . Depression Maternal Grandmother   . Parkinson's disease Maternal Grandmother   . Alzheimer's disease Maternal Grandmother     Social History: Social  History   Socioeconomic History  . Marital status: Divorced    Spouse name: Not on file  . Number of children: 1  . Years of education: Not on file  . Highest education level: Not on file  Social Needs  . Financial resource strain: Not on file  . Food insecurity - worry: Not on file  . Food insecurity - inability: Not on file  . Transportation needs - medical: Not on file  . Transportation needs - non-medical: Not on file  Occupational History  . Occupation: Retail banker: [employer]  Tobacco Use  . Smoking status: Never Smoker  . Smokeless tobacco: Never Used  Substance and Sexual Activity  . Alcohol use: No  . Drug use: No  . Sexual activity: Yes  Other Topics Concern  . Not on file  Social History Narrative   Divorced   One girl    Allergies: Allergies  Allergen Reactions  . Penicillins Itching    REACTION: feels hot, nauseated, and headache.   . Contrast Media [Iodinated Diagnostic Agents]     Per patient her mother told her she had a contrast  allergy causing pancreatitis. I suspect her pancreatitis was due to cholelithiasis. She remembers it was for a test where they injected contrast to evaluate her GB and biliary tree.     Current Medications: Current Outpatient Medications  Medication Sig Dispense Refill  . ALPRAZolam (XANAX) 0.25 MG tablet TAKE ONE TABLET BY MOUTH THREE TIMES DAILY AS NEEDED 20 tablet 0  . doxycycline (VIBRA-TABS) 100 MG tablet Take 1 tablet (100 mg total) by mouth 2 (two) times daily. 20 tablet 0  . fluticasone (FLONASE) 50 MCG/ACT nasal spray Place 2 sprays into both nostrils daily. 16 g 6  . phentermine (ADIPEX-P) 37.5 MG tablet Take 1 tablet (37.5 mg total) by mouth daily before breakfast. 30 tablet 1  . sertraline (ZOLOFT) 25 MG tablet TAKE ONE TABLET BY MOUTH ONCE DAILY 90 tablet 0   No current facility-administered medications for this visit.     Review of Systems General: fatigue  HEENT: no  complaints  Lungs: no complaints  Cardiac: no complaints  GI: occasional groin pain  GU: no complaints  Musculoskeletal: back pain  Extremities: no complaints  Skin: no complaints  Neuro: no complaints  Endocrine: no complaints  Psych: no complaints       Objective:  Physical Examination:  LMP 05/18/2016    ECOG Performance Status: 0 - Asymptomatic  General appearance: alert, cooperative and appears stated age HEENT:PERRLA, extra ocular movement intact and sclera clear, anicteric Lymph node survey: non-palpable, axillary, inguinal, supraclavicular Cardiovascular: RRR Respiratory: normal air entry, lungs clear to auscultation Abdomen: soft, nondistended, nontender, no masses palpated, no hernias and well healed incision Back: inspection of back is normal Extremities: extremities normal, atraumatic, no cyanosis or edema. No groin tenderness. No evidence of hernia but exam limited by habitus. Skin exam - normal coloration around incisions Neurological exam reveals alert, oriented, normal speech, no focal findings or movement disorder noted.  Pelvic: exam chaperoned by nurse;  Vulva: normal appearing vulva with no masses, tenderness or lesions; Vagina: normal vagina; Adnexa: no masses and nontender; Uterus: surgically absent, vaginal cuff well healed; Cervix: absent; Rectal: not indicated    Lab Review Labs on site today: none  Radiologic Imaging: pending    Assessment:  Hannah Stephenson is a 42 y.o. 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 Clinically NED based on exam.   Urinary symptoms consistent with UTI.  Plan:   Problem List Items Addressed This Visit    None     I reviewed recurrence risk. According to UpToDate neoplasms classified as STUMP have a recurrence risk estimated in one meta-analysis as 17 percent, but range from 5 to 30 percent. There is limited information on follow up but surveillance  every 6 months is reasonable. She has had a hysterectomy, so there may be limited role for imaging.   We will alternate surveillance with Dr. Kenton Kingfisher. I have recommended continued close follow up with exams, including pelvic exams every 6 months for 5 years and then annually thereafter.  Imaging and laboratory assessment is based on clinical indication. If after 5 years she is NED she can be released from our clinic but continued annual follow up is recommended with her local gynecologist or PCP.   The patient's diagnosis, an outline of the further diagnostic and laboratory studies which will be required, the recommendation, and alternatives were discussed.  All questions were answered to the patient's satisfaction.   Gillis Ends, MD    CC:  Herbie Baltimore  Salli Quarry, MD Ob/Gyn

## 2018-03-01 ENCOUNTER — Inpatient Hospital Stay: Payer: BLUE CROSS/BLUE SHIELD | Attending: Obstetrics and Gynecology

## 2018-04-12 IMAGING — CT CT ABD-PELV W/O CM
1 of 2 series · 12 of 29 positions shown, 15 images · non-contrast
Comparison: None.

CLINICAL DATA: Pt states she had a partial hysterectomy 06/03/2016
due to fibroid cyst. She states they found cancerous cells that this
scan is staging workup. They were able to remove all the cells
during the surgery. NKI. No hx CA. History of these cysts removed
from the anal area 5766. History of allergic reaction to iodinated
contrast from a gallbladder testing 7442.

EXAM:
CT CHEST, ABDOMEN AND PELVIS WITHOUT CONTRAST
TECHNIQUE: Multidetector CT imaging of the chest, abdomen and pelvis was
performed following the standard protocol without IV contrast.

[Series 3: axial st · axial · 0.70mm/px · z∈[-993,-468]mm · 12 of 127 slices shown, 15 images]
[im 11/127  mediastinal]
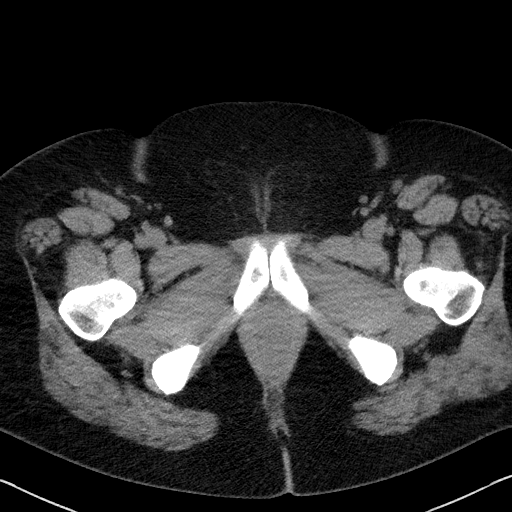
[im 11/127  lung]
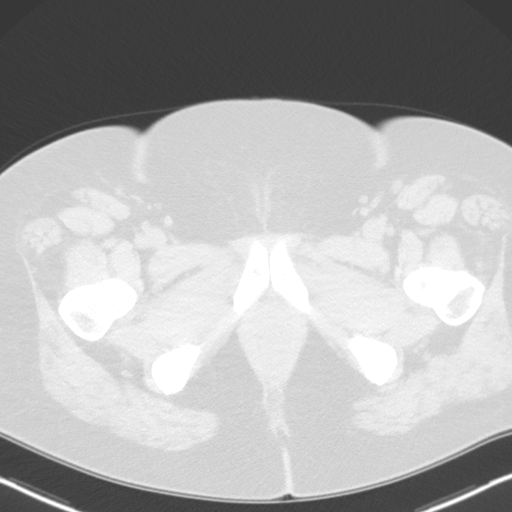
[im 22/127  lung]
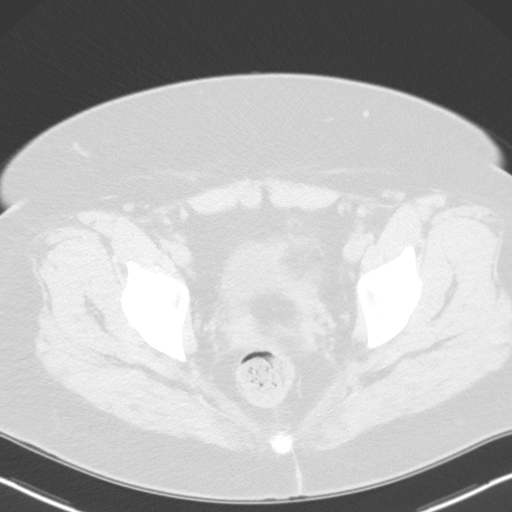
[im 32/127  lung]
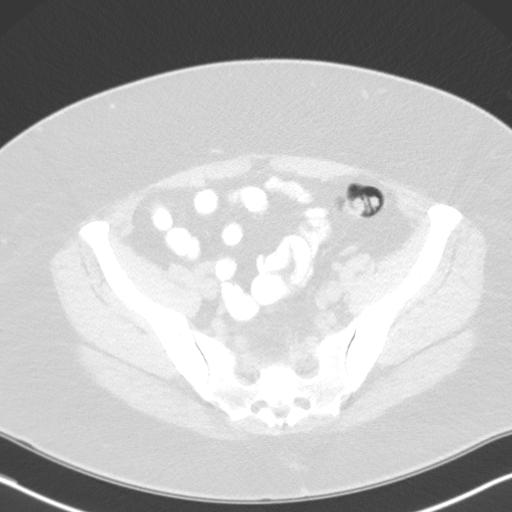
[im 43/127  lung]
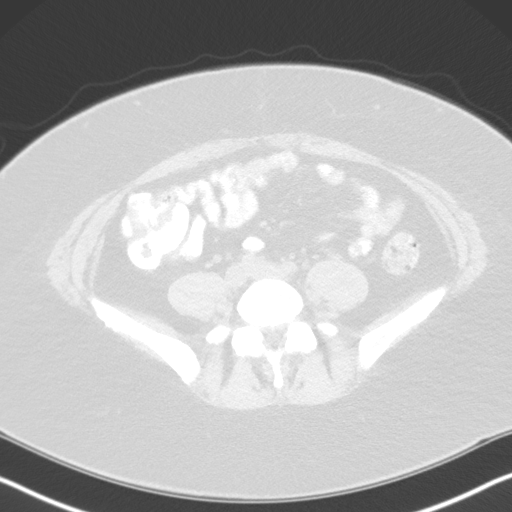
[im 53/127  mediastinal]
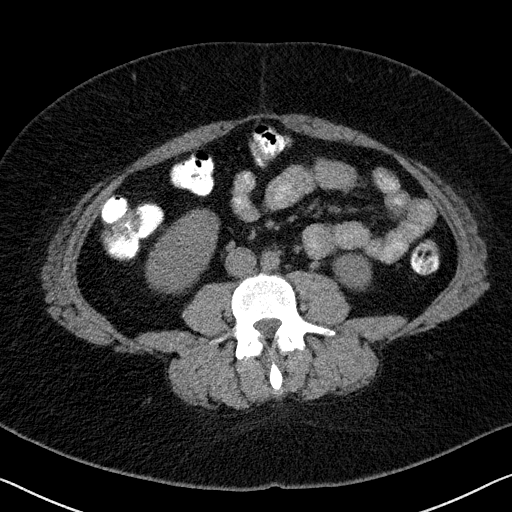
[im 53/127  lung]
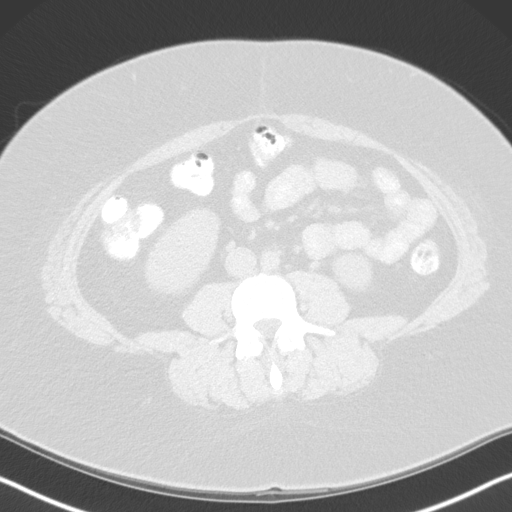
[im 62/127  lung]
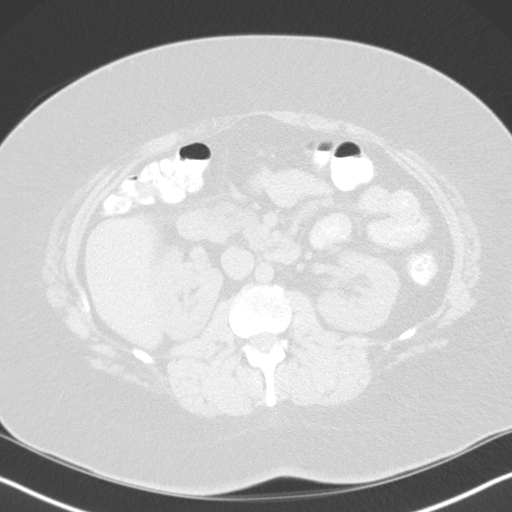
[im 64/127  lung]
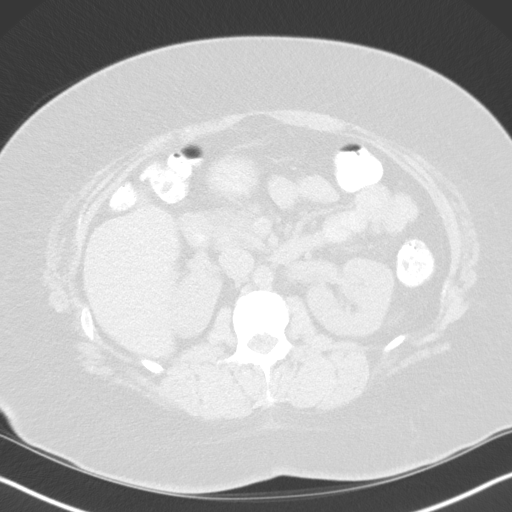
[im 74/127  lung]
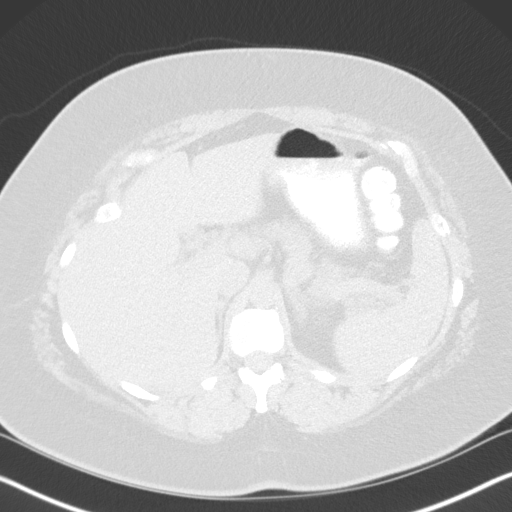
[im 85/127  mediastinal]
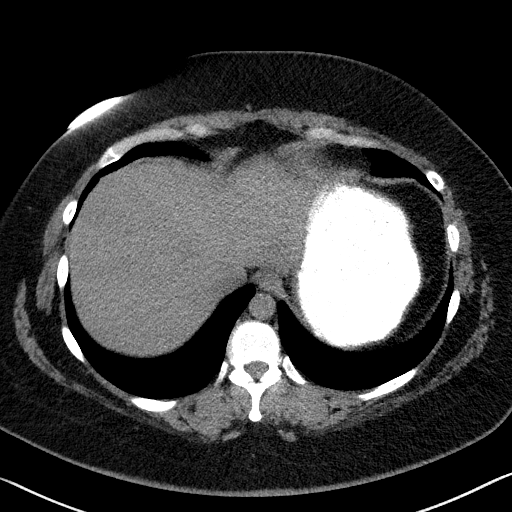
[im 85/127  lung]
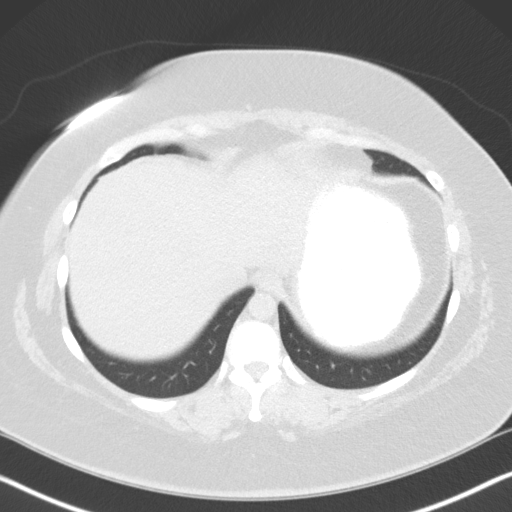
[im 95/127  lung]
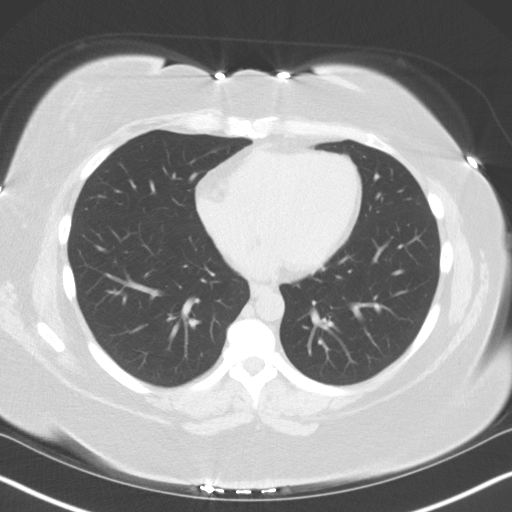
[im 106/127  lung]
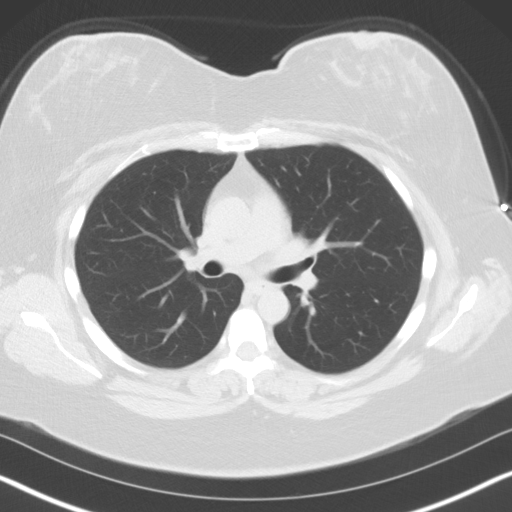
[im 116/127  lung]
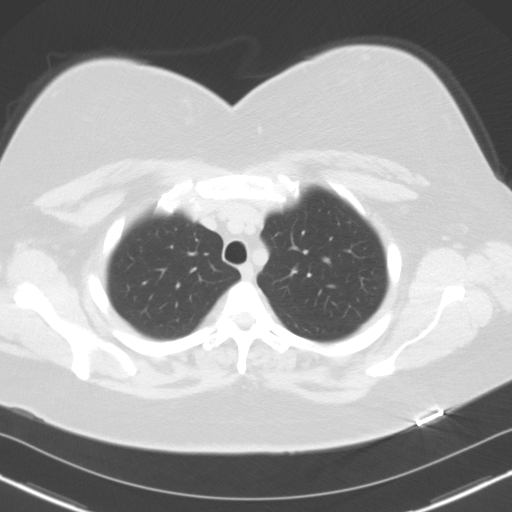

[12 of 29 positions shown; findings below may reference images not displayed]

FINDINGS: CT CHEST FINDINGS

Cardiovascular: Heart size is normal. Thoracic aorta and pulmonary
arteries have a normal noncontrast appearance.

Mediastinum/Nodes: Thyroid is normal in appearance. No mediastinal,
hilar, or axillary adenopathy. Esophagus is normal in appearance.

Lungs/Pleura: No pulmonary nodules, pleural effusions, or
infiltrates.

Musculoskeletal: Visualized osseous structures have a normal
appearance.

CT ABDOMEN PELVIS FINDINGS

Hepatobiliary: No focal abnormality identified within the liver.
Gallbladder is present and normal in appearance.

Pancreas: Normal noncontrast appearance.

Spleen: Normal noncontrast appearance.

Adrenals/Urinary Tract: Normal adrenal glands. Kidneys are normal in
size. No hydronephrosis. No intrarenal or ureteral calculi.

Stomach/Bowel: Stomach and small bowel loops are normal in
appearance. The appendix is well seen and has a normal appearance.
Colonic loops are normal in appearance.

Vascular/Lymphatic: No evidence for aortic aneurysm. No
retroperitoneal adenopathy. Nonspecific small sub cm mesenteric
lymph nodes are present.

Reproductive: Uterus is absent. The adnexal regions have a normal CT
appearance. There is no free pelvic fluid. No retroperitoneal or
side wall adenopathy.

Other: None

Musculoskeletal: Visualized osseous structures have a normal
appearance.
IMPRESSION: 1. No CT evidence for metastatic lesions.
2. Status post hysterectomy.
3. Normal appendix.

## 2018-05-02 ENCOUNTER — Ambulatory Visit: Payer: BLUE CROSS/BLUE SHIELD | Admitting: Family Medicine

## 2018-05-02 ENCOUNTER — Encounter: Payer: Self-pay | Admitting: Family Medicine

## 2018-05-02 VITALS — BP 134/84 | HR 102 | Temp 98.5°F | Ht 63.0 in | Wt 230.6 lb

## 2018-05-02 DIAGNOSIS — F341 Dysthymic disorder: Secondary | ICD-10-CM

## 2018-05-02 DIAGNOSIS — F329 Major depressive disorder, single episode, unspecified: Secondary | ICD-10-CM

## 2018-05-02 DIAGNOSIS — J0100 Acute maxillary sinusitis, unspecified: Secondary | ICD-10-CM | POA: Diagnosis not present

## 2018-05-02 DIAGNOSIS — F419 Anxiety disorder, unspecified: Secondary | ICD-10-CM

## 2018-05-02 DIAGNOSIS — Z23 Encounter for immunization: Secondary | ICD-10-CM | POA: Diagnosis not present

## 2018-05-02 DIAGNOSIS — F32A Depression, unspecified: Secondary | ICD-10-CM

## 2018-05-02 MED ORDER — ALPRAZOLAM 0.25 MG PO TABS: 0.2500 mg | ORAL_TABLET | Freq: Three times a day (TID) | ORAL | 0 refills | Status: DC | PRN

## 2018-05-02 MED ORDER — SERTRALINE HCL 50 MG PO TABS: 50.0000 mg | ORAL_TABLET | Freq: Every day | ORAL | 3 refills | Status: DC

## 2018-05-02 MED ORDER — DOXYCYCLINE HYCLATE 100 MG PO TABS: 100.0000 mg | ORAL_TABLET | Freq: Two times a day (BID) | ORAL | 0 refills | Status: DC

## 2018-05-02 NOTE — Assessment & Plan Note (Signed)
Given duration and progression of symptoms, will treat for bacterial sinusitis with doxycycline. PCN allergic. Call or return to clinic prn if these symptoms worsen or fail to improve as anticipated. The patient indicates understanding of these issues and agrees with the plan.

## 2018-05-02 NOTE — Assessment & Plan Note (Signed)
Deteriorated. PH9 23 today. Increase zoloft to 50 mg daily, refill xanax. Declining psychotherapy. Call or return to clinic prn if these symptoms worsen or fail to improve as anticipated. The patient indicates understanding of these issues and agrees with the plan.

## 2018-05-02 NOTE — Patient Instructions (Signed)
Great to see you.  Takes doxycyline 100 mg twice daily x 10 days. We are increasing your zoloft to 50 mg daily and refilling your xanax.  Please keep me updated.

## 2018-05-02 NOTE — Progress Notes (Signed)
Subjective:   Patient ID: Hannah Stephenson, female    DOB: 09/14/1976, 42 y.o.   MRN: [id number]  Hannah Stephenson is a pleasant 42 y.o. year old female who presents to clinic today with Sinusitis (Patient is C/O AU pain for several weeks.  Congestion over the past week.  She has maxillary sinus pressure and states that her teeth hurt.) and Anxiety/Depression (Patient is also here today to F/U with her anxiety and depression.  She currently takes Sertraline and Alprazolam for this and is compliant.  She has had quite a few changes in the past month and feels that her medication may need tweaking.  The last 7d she cries at the drop of a hat.  Can't sleep either.)  on 05/02/2018  HPI:  ? Sinus infection- Has had ear pain and congestion for past several weeks.  Over the past week, congestion has worsened and she now has sinus pressure and her teeth hurt. PCN allergic.  Anxiety/depression- symptoms have deteriorated.  Cries very easily and not sleeping well.  Has had several stressors in her life lately. Currently taking zoloft 25 mg daily with as needed xanax.  Has not refilled her xanax in almost a year because she felt she was doing better until recently (past 6 months). She talks to her mom regularly which helps, takes yoga too.  Trying to do more self care.    Current Outpatient Medications on File Prior to Visit  Medication Sig Dispense Refill  . ALPRAZolam (XANAX) 0.25 MG tablet TAKE ONE TABLET BY MOUTH THREE TIMES DAILY AS NEEDED 20 tablet 0  . sertraline (ZOLOFT) 25 MG tablet TAKE ONE TABLET BY MOUTH ONCE DAILY 90 tablet 0   No current facility-administered medications on file prior to visit.     Allergies  Allergen Reactions  . Penicillins Itching    REACTION: feels hot, nauseated, and headache.   . Contrast Media [Iodinated Diagnostic Agents]     Per patient her mother told her she had a contrast allergy causing pancreatitis. I suspect her pancreatitis was due to cholelithiasis.  She remembers it was for a test where they injected contrast to evaluate her GB and biliary tree.     Past Medical History:  Diagnosis Date  . Anxiety   . Depression   . History of pancreatitis   . Migraines   . OCD (obsessive compulsive disorder)   . Scoliosis    mild  . Wrist fracture, left     Past Surgical History:  Procedure Laterality Date  . CESAREAN SECTION    . CYSTOSCOPY  06/01/2016   Procedure: CYSTOSCOPY;  Surgeon: Gae Dry, MD;  Location: [facility] ORS;  Service: Gynecology;;  . DILATION AND CURETTAGE OF UTERUS    . LAPAROSCOPIC HYSTERECTOMY Bilateral 06/01/2016   Procedure: HYSTERECTOMY TOTAL LAPAROSCOPIC/ BILATERAL SALPINGECTOMY;  Surgeon: Gae Dry, MD;  Location: [facility] ORS;  Service: Gynecology;  Laterality: Bilateral;  . PILONIDAL CYST EXCISION    . UPPER GI ENDOSCOPY    . WISDOM TOOTH EXTRACTION      Family History  Problem Relation Age of Onset  . Depression Mother   . Depression Maternal Grandmother   . Parkinson's disease Maternal Grandmother   . Alzheimer's disease Maternal Grandmother     Social History   Socioeconomic History  . Marital status: Divorced    Spouse name: Not on file  . Number of children: 1  . Years of education: Not on file  . Highest education level: Not  on file  Occupational History  . 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 Financial resource strain: Not on file  . Food insecurity:    Worry: Not on file    Inability: Not on file  . Transportation needs:    Medical: Not on file    Non-medical: Not on file  Tobacco Use  . Smoking status: Never Smoker  . Smokeless tobacco: Never Used  Substance and Sexual Activity  . Alcohol use: No  . Drug use: No  . Sexual activity: Yes  Lifestyle  . Physical activity:    Days per week: Not on file    Minutes per session: Not on file  . Stress: Not on file  Relationships  . Social connections:    Talks on phone: Not  on file    Gets together: Not on file    Attends religious service: Not on file    Active member of club or organization: Not on file    Attends meetings of clubs or organizations: Not on file    Relationship status: Not on file  . Intimate partner violence:    Fear of current or ex partner: Not on file    Emotionally abused: Not on file    Physically abused: Not on file    Forced sexual activity: Not on file  Other Topics Concern  . Not on file  Social History Narrative   Divorced   One girl   The PMH, PSH, Social History, Family History, Medications, and allergies have been reviewed in [facility], and have been updated if relevant.   Review of Systems  Constitutional: Negative.  Negative for fever.  HENT: Positive for congestion, ear pain, sinus pressure and sinus pain. Negative for dental problem, drooling, ear discharge, facial swelling, hearing loss, mouth sores, nosebleeds, postnasal drip, sneezing, sore throat, tinnitus, trouble swallowing and voice change.   Eyes: Negative.   Respiratory: Positive for cough. Negative for shortness of breath.   Cardiovascular: Negative.   Gastrointestinal: Negative.   Endocrine: Negative.   Genitourinary: Negative.   Musculoskeletal: Negative.   Allergic/Immunologic: Negative.   Neurological: Negative.   Hematological: Negative.   Psychiatric/Behavioral: Positive for dysphoric mood and sleep disturbance. Negative for agitation, behavioral problems, confusion, decreased concentration, hallucinations, self-injury and suicidal ideas. The patient is nervous/anxious. The patient is not hyperactive.   All other systems reviewed and are negative.      Objective:    BP 134/84 (BP Location: Left Arm, Patient Position: Sitting, Cuff Size: Normal)   Pulse (!) 102   Temp 98.5 F (36.9 C) (Oral)   Ht 5\' 3"  (1.6 m)   Wt 230 lb 9.6 oz (104.6 kg)   LMP 05/18/2016   SpO2 94%   BMI 40.85 kg/m    Physical Exam  Constitutional: She is oriented to  person, place, and time. She appears well-developed and well-nourished. No distress.  HENT:  Head: Normocephalic and atraumatic.  Right Ear: A middle ear effusion is present.  Left Ear: Hearing normal. A middle ear effusion is present.  Nose: Right sinus exhibits maxillary sinus tenderness. Left sinus exhibits maxillary sinus tenderness and frontal sinus tenderness.  Mouth/Throat: Uvula is midline. Posterior oropharyngeal erythema present. No posterior oropharyngeal edema.  Eyes: EOM are normal.  Neck: Normal range of motion.  Cardiovascular: Normal rate and regular rhythm.  Pulmonary/Chest: Effort normal and breath sounds normal. No respiratory distress.  Musculoskeletal: Normal range of motion.  Neurological: She is alert and oriented to person, place, and time. She displays normal reflexes. No cranial nerve deficit. Coordination normal.  Skin: Skin is warm and dry. She is not diaphoretic.  Psychiatric: She has a normal mood and affect. Her behavior is normal. Judgment and thought content normal.          Assessment & Plan:   Acute maxillary sinusitis, recurrence not specified  ANXIETY DEPRESSION  Need for Tdap vaccination - Plan: Tdap vaccine greater than or equal to 7yo IM No follow-ups on file.

## 2018-05-16 ENCOUNTER — Ambulatory Visit: Payer: Self-pay | Admitting: *Deleted

## 2018-05-16 NOTE — Telephone Encounter (Signed)
Seen by Dr Deborra Medina 10 days ago for bilateral ear infection and sinus infection.Finished anti biotics today and still has l earache. Appointment made for tomorrow with Luetta Nutting.   Reason for Disposition . Earache  (Exceptions: brief ear pain of < 60 minutes duration, earache occurring during air travel  Answer Assessment - Initial Assessment Questions 1. LOCATION: "Which ear is involved?"      l  Ear   2. ONSET: "When did the ear start hurting"       For about 1 month   3. SEVERITY: "How bad is the pain?"  (Scale 1-10; mild, moderate or severe)   - MILD (1-3): doesn't interfere with normal activities    - MODERATE (4-7): interferes with normal activities or awakens from sleep    - SEVERE (8-10): excruciating pain, unable to do any normal activities        Mild   4. URI SYMPTOMS: " Do you have a runny nose or cough?"        Still has congestion worse at night  5. FEVER: "Do you have a fever?" If so, ask: "What is your temperature, how was it measured, and when did it start?"      No  6. CAUSE: "Have you been swimming recently?", "How often do you use Q-TIPS?", "Have you had any recent air travel or scuba diving?"      No  7. OTHER SYMPTOMS: "Do you have any other symptoms?" (e.g., headache, stiff neck, dizziness, vomiting, runny nose, decreased hearing)      Headache at times   8. PREGNANCY: "Is there any chance you are pregnant?" "When was your last menstrual period?"        N/A  Protocols used: EARACHE-A-AH

## 2018-05-17 ENCOUNTER — Encounter: Payer: Self-pay | Admitting: Family Medicine

## 2018-05-17 ENCOUNTER — Ambulatory Visit: Payer: BLUE CROSS/BLUE SHIELD | Admitting: Family Medicine

## 2018-05-17 VITALS — BP 124/90 | HR 80 | Temp 97.6°F | Wt 228.8 lb

## 2018-05-17 DIAGNOSIS — J0100 Acute maxillary sinusitis, unspecified: Secondary | ICD-10-CM | POA: Diagnosis not present

## 2018-05-17 MED ORDER — LEVOFLOXACIN 500 MG PO TABS: 500.0000 mg | ORAL_TABLET | Freq: Every day | ORAL | 0 refills | Status: DC

## 2018-05-17 NOTE — Patient Instructions (Signed)

## 2018-05-17 NOTE — Assessment & Plan Note (Signed)
Continued symptoms and signs of bacterial infection PCN allergic and failed course of doxycycline Rx for levaquin, discussed potential side effects of this medication and to discontinue if she notices any tendon or muscle pain.   May need to see ENT if not improving with this.

## 2018-05-17 NOTE — Progress Notes (Signed)
Hannah Stephenson - 42 y.o. female MRN [id number]  Date of birth: [birth year]  Subjective Chief Complaint  Patient presents with  . Ear Pain    HPI Hannah Stephenson is a 42 y.o. female here today for follow up of sinusitis.  Seen 1 week ago with sinus pressure, teeth and ear pain and diagnosed with sinusitis.  Started on doxycycline at that time and will complete course today.  Has had minor improvement but still having quite a bit of sinus pressure and left ear pain.  Sinus pressure and ear pain on R side have resolved. She denies fever, chills, chest pain, shortness of breath, bloody nasal discharge, ear drainage.    ROS: ROS completed and negative except as noted per HPI  Allergies  Allergen Reactions  . Penicillins Itching    REACTION: feels hot, nauseated, and headache.   . Contrast Media [Iodinated Diagnostic Agents]     Per patient her mother told her she had a contrast allergy causing pancreatitis. I suspect her pancreatitis was due to cholelithiasis. She remembers it was for a test where they injected contrast to evaluate her GB and biliary tree.   . Doxycycline Nausea And Vomiting    Past Medical History:  Diagnosis Date  . Anxiety   . Depression   . History of pancreatitis   . Migraines   . OCD (obsessive compulsive disorder)   . Scoliosis    mild  . Wrist fracture, left     Past Surgical History:  Procedure Laterality Date  . CESAREAN SECTION    . CYSTOSCOPY  06/01/2016   Procedure: CYSTOSCOPY;  Surgeon: Gae Dry, MD;  Location: [facility] OR[facility];  Service: Gynecology;;  . DILATION AND CURETTAGE OF UTERUS    . LAPAROSCOPIC HYSTERECTOMY Bilateral 06/01/2016   Procedure: HYSTERECTOMY TOTAL LAPAROSCOPIC/ BILATERAL SALPINGECTOMY;  Surgeon: Gae Dry, MD;  Location: [facility] ORS;  Service: Gynecology;  Laterality: Bilateral;  . PILONIDAL CYST EXCISION    . UPPER GI ENDOSCOPY    . WISDOM TOOTH EXTRACTION      Social History   Socioeconomic History  . Marital status:  Divorced    Spouse name: Not on file  . Number of children: 1  . Years of education: Not on file  . Highest education level: Not on file  Occupational History  . 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 Financial resource strain: Not on file  . Food insecurity:    Worry: Not on file    Inability: Not on file  . Transportation needs:    Medical: Not on file    Non-medical: Not on file  Tobacco Use  . Smoking status: Never Smoker  . Smokeless tobacco: Never Used  Substance and Sexual Activity  . Alcohol use: No  . Drug use: No  . Sexual activity: Yes  Lifestyle  . Physical activity:    Days per week: Not on file    Minutes per session: Not on file  . Stress: Not on file  Relationships  . Social connections:    Talks on phone: Not on file    Gets together: Not on file    Attends religious service: Not on file    Active member of club or organization: Not on file    Attends meetings of clubs or organizations: Not on file    Relationship status: Not on file  Other Topics Concern  . Not on file  Social History Narrative   Divorced   One girl    Family History  Problem Relation Age of Onset  . Depression Mother   . Depression Maternal Grandmother   . Parkinson's disease Maternal Grandmother   . Alzheimer's disease Maternal Grandmother     Health Maintenance  Topic Date Due  . HIV Screening  04/14/1989  . PAP SMEAR  06/02/2018 (Originally 04/15/1995)  . INFLUENZA VACCINE  07/13/2018  . TETANUS/TDAP  05/02/2028    ----------------------------------------------------------------------------------------------------------------------------------------------------------------------------------------------------------------- Physical Exam BP 124/90   Pulse 80   Temp 97.6 F (36.4 C)   Wt 228 lb 12.8 oz (103.8 kg)   LMP 05/18/2016   BMI 40.53 kg/m   Physical Exam  Constitutional: She is oriented to person, place,  and time. She appears well-nourished. No distress.  HENT:  L TM is erythematous with purulent fluid  Turbinates edematous.   L maxillary sinus tenderness.     Eyes: No scleral icterus.  Neck: Neck supple. No thyromegaly present.  Cardiovascular: Normal rate, regular rhythm and normal heart sounds.  Pulmonary/Chest: Effort normal and breath sounds normal.  Lymphadenopathy:    She has no cervical adenopathy.  Neurological: She is alert and oriented to person, place, and time.  Skin: Skin is warm and dry. No rash noted.  Psychiatric: She has a normal mood and affect. Her behavior is normal.    ------------------------------------------------------------------------------------------------------------------------------------------------------------------------------------------------------------------- Assessment and Plan  Acute sinusitis Continued symptoms and signs of bacterial infection PCN allergic and failed course of doxycycline Rx for levaquin, discussed potential side effects of this medication and to discontinue if she notices any tendon or muscle pain.   May need to see ENT if not improving with this.

## 2018-07-20 ENCOUNTER — Other Ambulatory Visit: Payer: Self-pay

## 2018-07-20 DIAGNOSIS — F329 Major depressive disorder, single episode, unspecified: Secondary | ICD-10-CM

## 2018-07-20 DIAGNOSIS — F419 Anxiety disorder, unspecified: Principal | ICD-10-CM

## 2018-07-20 DIAGNOSIS — F32A Depression, unspecified: Secondary | ICD-10-CM

## 2018-07-20 MED ORDER — ALPRAZOLAM 0.25 MG PO TABS: 0.2500 mg | ORAL_TABLET | Freq: Three times a day (TID) | ORAL | 0 refills | Status: DC | PRN

## 2018-07-20 NOTE — Progress Notes (Signed)
Printed/TA signed/faxed with message to pharm to advise pt that needs OV for future fills/must update CSC & UDS/thx dmf

## 2018-07-26 ENCOUNTER — Ambulatory Visit: Payer: BLUE CROSS/BLUE SHIELD | Admitting: Obstetrics & Gynecology

## 2018-09-05 ENCOUNTER — Ambulatory Visit: Payer: BLUE CROSS/BLUE SHIELD | Admitting: Obstetrics & Gynecology

## 2018-10-25 ENCOUNTER — Other Ambulatory Visit: Payer: Self-pay | Admitting: Family Medicine

## 2018-10-25 DIAGNOSIS — F329 Major depressive disorder, single episode, unspecified: Secondary | ICD-10-CM

## 2018-10-25 DIAGNOSIS — F419 Anxiety disorder, unspecified: Principal | ICD-10-CM

## 2018-10-25 DIAGNOSIS — F32A Depression, unspecified: Secondary | ICD-10-CM

## 2018-10-26 NOTE — Telephone Encounter (Signed)
TA-Pt last seen by you in May/put note to pharmacy that pt needs appt for future fills/will update CSC & UDS at that time/PMP ok no red flags/last filled on 8.8.19/thx dmf

## 2019-01-26 ENCOUNTER — Other Ambulatory Visit: Payer: Self-pay | Admitting: Family Medicine

## 2019-02-06 NOTE — Progress Notes (Unsigned)
   Subjective:   Patient ID: Hannah Stephenson, female    DOB: [birth year], 43 y.o.   MRN: [id number]  Hannah Stephenson is a pleasant 42 y.o. year old female who presents to clinic today with No chief complaint on file.  on 02/08/2019  HPI:  No show

## 2019-02-08 ENCOUNTER — Ambulatory Visit: Payer: BLUE CROSS/BLUE SHIELD | Admitting: Family Medicine

## 2019-03-07 ENCOUNTER — Ambulatory Visit (INDEPENDENT_AMBULATORY_CARE_PROVIDER_SITE_OTHER): Payer: Self-pay | Admitting: Family Medicine

## 2019-03-07 VITALS — Temp 97.8°F | Wt 203.0 lb

## 2019-03-07 DIAGNOSIS — F32A Depression, unspecified: Secondary | ICD-10-CM

## 2019-03-07 DIAGNOSIS — F341 Dysthymic disorder: Secondary | ICD-10-CM

## 2019-03-07 DIAGNOSIS — J0100 Acute maxillary sinusitis, unspecified: Secondary | ICD-10-CM

## 2019-03-07 DIAGNOSIS — F419 Anxiety disorder, unspecified: Secondary | ICD-10-CM

## 2019-03-07 DIAGNOSIS — F329 Major depressive disorder, single episode, unspecified: Secondary | ICD-10-CM

## 2019-03-07 MED ORDER — LEVOFLOXACIN 500 MG PO TABS: 500.0000 mg | ORAL_TABLET | Freq: Every day | ORAL | 0 refills | Status: DC

## 2019-03-07 MED ORDER — SERTRALINE HCL 50 MG PO TABS: 50.0000 mg | ORAL_TABLET | Freq: Every day | ORAL | 1 refills | Status: DC

## 2019-03-07 MED ORDER — ALPRAZOLAM 0.25 MG PO TABS
0.2500 mg | ORAL_TABLET | Freq: Three times a day (TID) | ORAL | 5 refills | Status: DC | PRN
Start: 2019-03-07 — End: 2019-11-23

## 2019-03-07 NOTE — Assessment & Plan Note (Signed)
Given duration and progression of symptoms, will treat for bacterial sinusitis with levaquin.  She has tolerated this well in past but discussed potential side effects including myalgias and tendon rupture. Continue supportive care. Call or return to clinic prn if these symptoms worsen or fail to improve as anticipated. The patient indicates understanding of these issues and agrees with the plan.

## 2019-03-07 NOTE — Progress Notes (Signed)
Virtual Visit via Video   I connected with Hannah Stephenson on 03/07/19 at  5:20 PM EDT by a video enabled telemedicine application and verified that I am speaking with the correct person using two identifiers. Location patient: Home Location provider: [facility], Office Persons participating in the virtual visit: Kinzly R Lanney Gins, MD   I discussed the limitations of evaluation and management by telemedicine and the availability of in person appointments. The patient expressed understanding and agreed to proceed.  Subjective:   HPI:   Anxiety and depression-   She is doing well on her current regimen for Anxiety and Depression. She has changed jpbs and things are much better. She is requesting refills of Alprazolam and Zoloft.  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  Now that she is happy, she is not overeating and has lost weight. Walking around [place].   Currently taking zoloft 50 mg daily and alprazolam 0.25 mg three times daily prn anxiety. Now has not has needed to take xanax since she started her new job.  Of note, PDMP reviewed today and she has not refilled alprazolam since 10/2018.  GAD 7 : Generalized Anxiety Score 03/07/2019 05/02/2018  Nervous, Anxious, on Edge 1 3  Control/stop worrying 2 3  Worry too much - different things 0 3  Trouble relaxing 1 2  Restless 0 1  Easily annoyed or irritable 1 3  Afraid - awful might happen 0 2  Total GAD 7 Score 5 17  Anxiety Difficulty Not difficult at all Extremely difficult      Office Visit from 03/07/2019 in [facility]  PHQ-9 Total Score  2      Otalgia- She is C/O ear issues x50month. She has been Tx with OTC sinus decongestant (not sure which one), a nasal apray, claritin and benadryl qhs but both ears are starting to hurt. If she misses a day it becomes to where they are stuffed with fluid. Denies fever. She has maxillary sinus pressue that is severe in nature.   PMH significant for PCN allergy and doxycycline intolerance.  ROS: See pertinent positives and negatives per HPI.  Review of Systems  Constitutional: Negative for fever.  HENT: Positive for congestion, ear pain, sinus pressure and sinus pain. Negative for dental problem, drooling, ear discharge, facial swelling, hearing loss, mouth sores and rhinorrhea.   Respiratory: Negative.   Cardiovascular: Negative.   Gastrointestinal: Negative.   Endocrine: Negative.   Genitourinary: Negative.   Musculoskeletal: Negative.   Allergic/Immunologic: Negative.   Neurological: Negative.   Hematological: Negative.   Psychiatric/Behavioral: Negative.   All other systems reviewed and are negative.   Patient Active Problem List   Diagnosis Date Noted  . Morbid obesity ([place]) 08/04/2017  . BMI 40.0-44.9, adult ([place]) 08/04/2017  . Fibroid uterus 06/01/2016  . Menorrhagia 06/01/2016  . Acute sinusitis 03/10/2015  . Menstrual cramps 08/25/2012  . SOCIAL PHOBIA 12/28/2010  . ELEVATED BP READING WITHOUT DX HYPERTENSION 08/20/2010  . BACK PAIN 03/03/2010  . IRREGULAR MENSTRUAL CYCLE 02/25/2010  . MIGRAINE VARIANT 12/29/2009  . FATIGUE 12/25/2009  . MONONUCLEOSIS 10/24/2009  . ANXIETY DEPRESSION 10/24/2009  . SCOLIOSIS 10/24/2009  . PANCREATITIS, HX OF 10/24/2009    Social History   Tobacco Use  . Smoking status: Never Smoker  . Smokeless tobacco: Never Used  Substance Use Topics  . Alcohol use: No    Current Outpatient Medications:  .  ALPRAZolam (XANAX) 0.25 MG tablet, Take  1 tablet (0.25 mg total) by mouth 3 (three) times daily as needed., Disp: 30 tablet, Rfl: 5 .  sertraline (ZOLOFT) 50 MG tablet, Take 1 tablet (50 mg total) by mouth daily., Disp: 90 tablet, Rfl: 1  Allergies  Allergen Reactions  . Penicillins Itching    REACTION: feels hot, nauseated, and headache.   . Contrast Media [Iodinated Diagnostic Agents]     Per patient her mother told her she had a contrast allergy  causing pancreatitis. I suspect her pancreatitis was due to cholelithiasis. She remembers it was for a test where they injected contrast to evaluate her GB and biliary tree.   . Doxycycline Nausea And Vomiting    Objective:  Temp 97.8 F (36.6 C) (Oral)   Wt 203 lb (92.1 kg)   LMP 05/18/2016   BMI 35.96 kg/m   Wt Readings from Last 3 Encounters:  03/07/19 203 lb (92.1 kg)  05/17/18 228 lb 12.8 oz (103.8 kg)  05/02/18 230 lb 9.6 oz (104.6 kg)      VITALS: Per patient if applicable, see vitals. GENERAL: Alert, appears well and in no acute distress. NECK: Normal movements of the head and neck. HEENT:- sounds congested, nasal speech, went asked to bend over to touch her toes, she feels pressure in her sinuses. CARDIOPULMONARY: No increased WOB. Speaking in clear sentences. I:E ratio WNL.  MS: Moves all visible extremities without noticeable abnormality. PSYCH: Pleasant and cooperative, well-groomed. Speech normal rate and rhythm. Affect is appropriate. Insight and judgement are appropriate. Attention is focused, linear, and appropriate.  NEURO: CN grossly intact. Oriented as arrived to appointment on time with no prompting. Moves both UE equally.  SKIN: No obvious lesions, wounds, erythema, or cyanosis noted on face or hands.  Assessment and Plan:   Erial was seen today for follow-up and otalgia.  Diagnoses and all orders for this visit:  Anxiety and depression -     ALPRAZolam (XANAX) 0.25 MG tablet; Take 1 tablet (0.25 mg total) by mouth 3 (three) times daily as needed.  Other orders -     sertraline (ZOLOFT) 50 MG tablet; Take 1 tablet (50 mg total) by mouth daily.    . Reviewed expectations re: course of current medical issues. . Discussed self-management of symptoms. . Outlined signs and symptoms indicating need for more acute intervention. . Patient verbalized understanding and all questions were answered. Marland Kitchen Health Maintenance issues including appropriate healthy  diet, exercise, and smoking avoidance were discussed with patient. . See orders for this visit as documented in the electronic medical record.  Arnette Norris, MD 03/07/2019

## 2019-03-07 NOTE — Assessment & Plan Note (Signed)
Greatly improved. GAD 7 score decreased to 5 today from 17 in 04/2018 and PHQ was only 2 today. Continue current dose of zoloft and as needed xanax.  She is using xanax sparingly. Call or return to clinic prn if these symptoms worsen or fail to improve as anticipated. The patient indicates understanding of these issues and agrees with the plan.

## 2019-03-08 ENCOUNTER — Ambulatory Visit: Payer: BLUE CROSS/BLUE SHIELD | Admitting: Family Medicine

## 2019-06-08 ENCOUNTER — Encounter: Payer: Self-pay | Admitting: Family Medicine

## 2019-06-08 ENCOUNTER — Ambulatory Visit (INDEPENDENT_AMBULATORY_CARE_PROVIDER_SITE_OTHER): Payer: BC Managed Care – PPO | Admitting: Family Medicine

## 2019-06-08 VITALS — Temp 100.0°F

## 2019-06-08 DIAGNOSIS — J0100 Acute maxillary sinusitis, unspecified: Secondary | ICD-10-CM | POA: Diagnosis not present

## 2019-06-08 MED ORDER — AZITHROMYCIN 250 MG PO TABS: ORAL_TABLET | ORAL | 0 refills | Status: DC

## 2019-06-08 NOTE — Progress Notes (Signed)
Virtual Visit via Video Note  I connected with Hannah Stephenson on 06/08/19 at 11:30 AM EDT by a video enabled telemedicine application and verified that I am speaking with the correct person using two identifiers. Location patient: home Location provider: work  Persons participating in the virtual visit: patient, provider  I discussed the limitations of evaluation and management by telemedicine and the availability of in person appointments. The patient expressed understanding and agreed to proceed.  Chief Complaint  Patient presents with  . Ear Pain    pt complains of ear pain both ears/ feels like inner ear pain/ fever of 100 today before meds/ 7 days/ gets dizzy when moving too fast/ nasal congestion/drainage/sore throat/ cough with mucous/ allergy medication, nasal spray, benedryl     HPI: LEEAH Stephenson is a 43 y.o. female who complains of 7 day h/o B/L ear pain/pressure, facial fullness/pressure, PND, nasal congestion, PND. Pt states she feels lightheaded if she moves her head too fast.  She had low grade temp of 100 this AM.  No cough, SOB, sore throat.  Pt notes a h/o allergy and ear problems. She has been taking her normal allergy med and for the past week has been using an OTC nasal spray (non-steroid), benadryl PRN and alka seltzer cold and sinus.   Past Medical History:  Diagnosis Date  . Anxiety   . Depression   . History of infection due to human papilloma virus (HPV)   . History of pancreatitis   . Migraines   . OCD (obsessive compulsive disorder)   . Scoliosis    mild  . Wrist fracture, left     Past Surgical History:  Procedure Laterality Date  . CESAREAN SECTION    . CYSTOSCOPY  06/01/2016   Procedure: CYSTOSCOPY;  Surgeon: Gae Dry, MD;  Location: [facility] ORS;  Service: Gynecology;;  . DILATION AND CURETTAGE OF UTERUS    . LAPAROSCOPIC HYSTERECTOMY Bilateral 06/01/2016   Procedure: HYSTERECTOMY TOTAL LAPAROSCOPIC/ BILATERAL SALPINGECTOMY;  Surgeon:  Gae Dry, MD;  Location: [facility] ORS;  Service: Gynecology;  Laterality: Bilateral;  . PILONIDAL CYST EXCISION    . UPPER GI ENDOSCOPY    . WISDOM TOOTH EXTRACTION      Family History  Problem Relation Age of Onset  . Depression Mother   . Depression Maternal Grandmother   . Parkinson's disease Maternal Grandmother   . Alzheimer's disease Maternal Grandmother     Social History   Tobacco Use  . Smoking status: Never Smoker  . Smokeless tobacco: Never Used  Substance Use Topics  . Alcohol use: No  . Drug use: No     Current Outpatient Medications:  .  ALPRAZolam (XANAX) 0.25 MG tablet, Take 1 tablet (0.25 mg total) by mouth 3 (three) times daily as needed., Disp: 30 tablet, Rfl: 5 .  sertraline (ZOLOFT) 50 MG tablet, Take 1 tablet (50 mg total) by mouth daily., Disp: 90 tablet, Rfl: 1 .  azithromycin (ZITHROMAX) 250 MG tablet, 2 tabs po x 1 day then 1 tab po daily, Disp: 6 tablet, Rfl: 0  Allergies  Allergen Reactions  . Penicillins Itching    REACTION: feels hot, nauseated, and headache.   . Contrast Media [Iodinated Diagnostic Agents]     Per patient her mother told her she had a contrast allergy causing pancreatitis. I suspect her pancreatitis was due to cholelithiasis. She remembers it was for a test where they injected contrast to evaluate her GB and biliary tree.   Marland Kitchen  Doxycycline Nausea And Vomiting      ROS: See pertinent positives and negatives per HPI.   EXAM:  VITALS per patient if applicable: Temp 080 F (37.8 C) (Oral)   LMP 05/18/2016    GENERAL: alert, oriented, appears fatigued  HEENT: atraumatic, conjunctiva clear, pt sounds congested  NECK: normal movements of the head and neck  LUNGS: on inspection no signs of respiratory distress, breathing rate appears normal, no obvious gross SOB, gasping or wheezing, no conversational dyspnea  CV: no obvious cyanosis  MS: moves all visible extremities without noticeable abnormality  PSYCH/NEURO:  pleasant and cooperative, speech and thought processing grossly intact   ASSESSMENT AND PLAN:  1. Acute non-recurrent maxillary sinusitis - cont supportive care - add flonase daily, mucinex BID, nasal saline spray 2-3x/day Rx: - azithromycin (ZITHROMAX) 250 MG tablet; 2 tabs po x 1 day then 1 tab po daily  Dispense: 6 tablet; Refill: 0 - pt with PCN and doxycycline allergy/intolerance. Pt states azithromycin has worked for her in the past - f/u if symptoms worsen or do not improve in 7-10 days   I discussed the assessment and treatment plan with the patient. The patient was provided an opportunity to ask questions and all were answered. The patient agreed with the plan and demonstrated an understanding of the instructions.   The patient was advised to call back or seek an in-person evaluation if the symptoms worsen or if the condition fails to improve as anticipated.   Letta Median, DO

## 2019-06-11 ENCOUNTER — Telehealth: Payer: Self-pay

## 2019-06-11 NOTE — Telephone Encounter (Signed)
Copied from [place] [id number]. Topic: General - Inquiry >> Jun 11, 2019  9:42 AM Mathis Bud wrote: Reason for CRM: Patient is calling to speak to PCP or nurse regarding that her work will not let her go back to work due to sinus infection.  Patient states her work would like her to get a covid-19 test, but patient states she does not qualify.  Patient requesting PCP to call back. Patient call back # [phone]

## 2019-06-12 ENCOUNTER — Ambulatory Visit: Payer: Self-pay | Admitting: Family Medicine

## 2019-06-12 ENCOUNTER — Telehealth: Payer: BC Managed Care – PPO | Admitting: Family

## 2019-06-12 ENCOUNTER — Telehealth: Payer: Self-pay

## 2019-06-12 DIAGNOSIS — Z20822 Contact with and (suspected) exposure to covid-19: Secondary | ICD-10-CM

## 2019-06-12 DIAGNOSIS — Z20828 Contact with and (suspected) exposure to other viral communicable diseases: Secondary | ICD-10-CM | POA: Diagnosis not present

## 2019-06-12 MED ORDER — BENZONATATE 100 MG PO CAPS: 100.0000 mg | ORAL_CAPSULE | Freq: Three times a day (TID) | ORAL | 0 refills | Status: DC | PRN

## 2019-06-12 NOTE — Telephone Encounter (Signed)
Yes I advised okay to send to [facility] pool for testing.

## 2019-06-12 NOTE — Telephone Encounter (Signed)
TA-Plz see complete message below/she is requesting the COVID testing/may I send it to the [facility] testing poole? Plz advise/thx dmf

## 2019-06-12 NOTE — Telephone Encounter (Signed)
Hannah Stephenson with [facility] calls; this is Dr Hulen Shouts pt and [place] will contact [facility].

## 2019-06-12 NOTE — Telephone Encounter (Signed)
Yes okay to send to [facility] pool

## 2019-06-12 NOTE — Progress Notes (Signed)
E-Visit for Corona Virus Screening   Your current symptoms could be consistent with the coronavirus.  Call your health care provider or local health department to request and arrange formal testing. Many health care providers can now test patients at their office but not all are.  Please quarantine yourself while awaiting your test results.  [place] [phone], [place], [place] [phone] or visit [email or web address]    I have sent a referral to our Community testing site. They should contact you to set up an appointment.    Approximately 5 minutes was spent documenting and reviewing patient's chart.   COVID-19 is a respiratory illness with symptoms that are similar to the flu. Symptoms are typically mild to moderate, but there have been cases of severe illness and death due to the virus. The following symptoms may appear 2-14 days after exposure: . Fever . Cough . Shortness of breath or difficulty breathing . Chills . Repeated shaking with chills . Muscle pain . Headache . Sore throat . New loss of taste or smell . Fatigue . Congestion or runny nose . Nausea or vomiting . Diarrhea  It is vitally important that if you feel that you have an infection such as this virus or any other virus that you stay home and away from places where you may spread it to others.  You should self-quarantine for 14 days if you have symptoms that could potentially be coronavirus or have been in close contact a with a person diagnosed with COVID-19 within the last 2 weeks. You should avoid contact with people age 77 and older.   You should wear a mask or cloth face covering over your nose and mouth if you must be around other people or animals, including pets (even at home). Try to stay at least 6 feet away from other people. This will protect the people around  you.  You can use medication such as A prescription cough medication called Tessalon Perles 100 mg. You may take 1-2 capsules every 8 hours as needed for cough  You may also take acetaminophen (Tylenol) as needed for fever.   Reduce your risk of any infection by using the same precautions used for avoiding the common cold or flu:  Marland Kitchen Wash your hands often with soap and warm water for at least 20 seconds.  If soap and water are not readily available, use an alcohol-based hand sanitizer with at least 60% alcohol.  . If coughing or sneezing, cover your mouth and nose by coughing or sneezing into the elbow areas of your shirt or coat, into a tissue or into your sleeve (not your hands). . Avoid shaking hands with others and consider head nods or verbal greetings only. . Avoid touching your eyes, nose, or mouth with unwashed hands.  . Avoid close contact with people who are sick. . Avoid places or events with large numbers of people in one location, like concerts or sporting events. . Carefully consider travel plans you have or are making. . If you are planning any travel outside or inside the [place], visit the CDC's Travelers' Health webpage for the latest health notices. . If you have some symptoms but not all symptoms, continue to monitor at home and seek medical attention if your symptoms worsen. . If you are having a medical emergency, call 911.  HOME CARE . Only take medications as instructed by your medical team. . Drink plenty of fluids and get plenty of  rest. . A steam or ultrasonic humidifier can help if you have congestion.   GET HELP RIGHT AWAY IF YOU HAVE EMERGENCY WARNING SIGNS** FOR COVID-19. If you or someone is showing any of these signs seek emergency medical care immediately. Call 911 or proceed to your closest emergency facility if: . You develop worsening high fever. . Trouble breathing . Bluish lips or face . Persistent pain or pressure in the chest . New  confusion . Inability to wake or stay awake . You cough up blood. . Your symptoms become more severe  **This list is not all possible symptoms. Contact your medical provider for any symptoms that are sever or concerning to you.   MAKE SURE YOU   Understand these instructions.  Will watch your condition.  Will get help right away if you are not doing well or get worse.  Your e-visit answers were reviewed by a board certified advanced clinical practitioner to complete your personal care plan.  Depending on the condition, your plan could have included both over the counter or prescription medications.  If there is a problem please reply once you have received a response from your provider.  Your safety is important to [place].  If you have drug allergies check your prescription carefully.    You can use MyChart to ask questions about today's visit, request a non-urgent call back, or ask for a work or school excuse for 24 hours related to this e-Visit. If it has been greater than 24 hours you will need to follow up with your provider, or enter a new e-Visit to address those concerns. You will get an e-mail in the next two days asking about your experience.  I hope that your e-visit has been valuable and will speed your recovery. Thank you for using e-visits.

## 2019-06-12 NOTE — Telephone Encounter (Signed)
Pt called in yesterday leaving a message for the [employer] practice that her job is requiring she be tested for the COVID-19 even though she was seen by Dr. Bryan Lemma on 06/08/2019 for the sinus infection.  She can't return to work until she is tested and has a result. She hasn't heard and was calling back in this morning to follow up.  I called the [place] office and they requested I send the message over again because Dr. Bryan Lemma is out of the office for the rest of the week.   Her PCP is Dr. Deborra Medina.  I called the pt and let her know her message was received and we are waiting on a response from Dr. Deborra Medina.  She needs to know something so she can be tested and go back to work.  (I initially called [place] practice because the CMA was listed as being from [facility] that had handled the call yesterday.   I spoke with Rena and in fact the pt is not their pt.   She goes to [employer]).     Reason for Disposition . [1] Follow-up call to recent contact AND [2] information only call, no triage required  Answer Assessment - Initial Assessment Questions 1. REASON FOR CALL or QUESTION: "What is your reason for calling today?" or "How can I best help you?" or "What question do you have that I can help answer?"     She called in yesterday but has not gotten a response from Dr. Hulen Shouts office regarding being tested for COVID-19 that her job is requiring.   See documentation notes for details.  Protocols used: INFORMATION ONLY CALL-A-AH

## 2019-06-12 NOTE — Telephone Encounter (Signed)
[facility] called because pt called them making sure we got the message and [facility] said the department for the message showed [facility] but Judson Roch was working there yesterday and that should be why, I advised that it was sent to Dr Deborra Medina and nurse Selinda Eon and they will look into it

## 2019-06-13 ENCOUNTER — Telehealth: Payer: Self-pay | Admitting: Family Medicine

## 2019-06-13 ENCOUNTER — Telehealth: Payer: Self-pay | Admitting: *Deleted

## 2019-06-13 NOTE — Telephone Encounter (Signed)
I left pt a voicemail letting her know that we can do the note, but we need to know the covid test result first. I advised her to call back once she has the results & we can provide the note.

## 2019-06-13 NOTE — Telephone Encounter (Signed)
Pt called and said she was finally able to get someone to test her yesterday but she said she still sure she only had a sinus infection and that has pretty much gone away at this point, she stated her boss wont let her return to work without a doctors note. She requested a call back from either Dr Deborra Medina or Selinda Eon

## 2019-06-13 NOTE — Telephone Encounter (Signed)
Dr. Deborra Medina - please advise if okay for note? Pt was [facility] Covid [facility] last night & is waiting on the results now.

## 2019-06-13 NOTE — Telephone Encounter (Signed)
See other phone note

## 2019-06-13 NOTE — Telephone Encounter (Signed)
Yes absolutely she can have a note.  Thank you for doing this.

## 2019-06-13 NOTE — Telephone Encounter (Signed)
Pt referred for COVID-19 testing by Chrisy Hawks,NP after evisit. Pt called to schedule testing but pt states she had testing done at Urgent Care last night.

## 2019-06-13 NOTE — Telephone Encounter (Signed)
error 

## 2019-11-20 ENCOUNTER — Other Ambulatory Visit: Payer: Self-pay | Admitting: Family Medicine

## 2019-11-20 DIAGNOSIS — F32A Depression, unspecified: Secondary | ICD-10-CM

## 2019-11-20 DIAGNOSIS — F329 Major depressive disorder, single episode, unspecified: Secondary | ICD-10-CM

## 2019-11-20 NOTE — Telephone Encounter (Signed)
I sent MyChart message asking if pt can be placed on Dr. Hulen Shouts schedule for Thursday/awaiting response/thx dmf

## 2019-11-28 NOTE — Progress Notes (Signed)
Virtual Visit via Video   Due to the COVID-19 pandemic, this visit was completed with telemedicine (audio/video) technology to reduce patient and provider exposure as well as to preserve personal protective equipment.   I connected with Hannah Stephenson by a video enabled telemedicine application and verified that I am speaking with the correct person using two identifiers. Location patient: Home Location provider: [facility], Office Persons participating in the virtual visit: Hannah R Lanney Gins, MD   I discussed the limitations of evaluation and management by telemedicine and the availability of in person appointments. The patient expressed understanding and agreed to proceed.  Care Team   Patient Care Team: Lucille Passy, MD as PCP - General Clent Jacks, RN as Registered Nurse  Subjective:   HPI: Patient is connecting virtually today to follow-up with Depression & Anxiety.  She has had more pressure at work but overall doing okay, just needed more xanax due to pressure at work.     She currently takes Sertraline 50mg  daily  and low-dose Alprazolam 0.25mg  tid prn which per [place] PMP pt is compliant without red flags.  Last filled #10 on 12.11.20 and prior to that was #10 on 3.25.20.     Depression screen [facility] 2/9 11/29/2019 03/07/2019 05/02/2018  Decreased Interest 0 1 3  Down, Depressed, Hopeless 0 1 2  PHQ - 2 Score 0 2 5  Altered sleeping 1 0 3  Tired, decreased energy 3 0 3  Change in appetite 1 0 3  Feeling bad or failure about yourself  1 0 3  Trouble concentrating 0 0 3  Moving slowly or fidgety/restless 0 0 3  Suicidal thoughts 0 0 0  PHQ-9 Score 6 2 23   Difficult doing work/chores Somewhat difficult Not difficult at all Extremely dIfficult    GAD 7 : Generalized Anxiety Score 03/07/2019 05/02/2018  Nervous, Anxious, on Edge 1 3  Control/stop worrying 2 3  Worry too much - different things 0 3  Trouble relaxing 1 2  Restless 0 1  Easily annoyed  or irritable 1 3  Afraid - awful might happen 0 2  Total GAD 7 Score 5 17  Anxiety Difficulty Not difficult at all Extremely difficult     Review of Systems  Constitutional: Negative for chills and fever.  HENT: Negative for congestion and hearing loss.   Eyes: Negative for discharge and redness.  Respiratory: Negative for cough and shortness of breath.   Cardiovascular: Negative for chest pain.  Gastrointestinal: Negative for abdominal pain and heartburn.  Genitourinary: Negative for dysuria.  Musculoskeletal: Negative for falls and myalgias.  Skin: Negative for rash.  Neurological: Negative for dizziness and loss of consciousness.  Endo/Heme/Allergies: Does not bruise/bleed easily.  Psychiatric/Behavioral: Negative for depression and memory loss.     Patient Active Problem List   Diagnosis Date Noted  . Morbid obesity ([place]) 08/04/2017  . BMI 35.0-35.9,adult 08/04/2017  . Fibroid uterus 06/01/2016  . Menorrhagia 06/01/2016  . Acute sinusitis 03/10/2015  . Menstrual cramps 08/25/2012  . SOCIAL PHOBIA 12/28/2010  . ELEVATED BP READING WITHOUT DX HYPERTENSION 08/20/2010  . BACK PAIN 03/03/2010  . IRREGULAR MENSTRUAL CYCLE 02/25/2010  . MIGRAINE VARIANT 12/29/2009  . FATIGUE 12/25/2009  . MONONUCLEOSIS 10/24/2009  . ANXIETY DEPRESSION 10/24/2009  . SCOLIOSIS 10/24/2009  . PANCREATITIS, HX OF 10/24/2009    Social History   Tobacco Use  . Smoking status: Never Smoker  . Smokeless tobacco: Never Used  Substance Use Topics  .  Alcohol use: No    Current Outpatient Medications:  .  ALPRAZolam (XANAX) 0.25 MG tablet, Take 1 tablet (0.25 mg total) by mouth 3 (three) times daily as needed., Disp: 30 tablet, Rfl: 0 .  sertraline (ZOLOFT) 50 MG tablet, Take 1 tablet (50 mg total) by mouth daily., Disp: 90 tablet, Rfl: 0  Allergies  Allergen Reactions  . Penicillins Itching    REACTION: feels hot, nauseated, and headache.   . Contrast Media [Iodinated Diagnostic Agents]      Per patient her mother told her she had a contrast allergy causing pancreatitis. I suspect her pancreatitis was due to cholelithiasis. She remembers it was for a test where they injected contrast to evaluate her GB and biliary tree.   . Doxycycline Nausea And Vomiting    Objective:  Ht 5\' 3"  (1.6 m)   Wt 203 lb (92.1 kg)   LMP 05/18/2016   BMI 35.96 kg/m   VITALS: Per patient if applicable, see vitals. GENERAL: Alert, appears well and in no acute distress. HEENT: Atraumatic, conjunctiva clear, no obvious abnormalities on inspection of external nose and ears. NECK: Normal movements of the head and neck. CARDIOPULMONARY: No increased WOB. Speaking in clear sentences. I:E ratio WNL.  MS: Moves all visible extremities without noticeable abnormality. PSYCH: Pleasant and cooperative, well-groomed. Speech normal rate and rhythm. Affect is appropriate. Insight and judgement are appropriate. Attention is focused, linear, and appropriate.  NEURO: CN grossly intact. Oriented as arrived to appointment on time with no prompting. Moves both UE equally.  SKIN: No obvious lesions, wounds, erythema, or cyanosis noted on face or hands.  Depression screen [facility] 2/9 11/29/2019 03/07/2019 05/02/2018  Decreased Interest 0 1 3  Down, Depressed, Hopeless 0 1 2  PHQ - 2 Score 0 2 5  Altered sleeping 1 0 3  Tired, decreased energy 3 0 3  Change in appetite 1 0 3  Feeling bad or failure about yourself  1 0 3  Trouble concentrating 0 0 3  Moving slowly or fidgety/restless 0 0 3  Suicidal thoughts 0 0 0  PHQ-9 Score 6 2 23   Difficult doing work/chores Somewhat difficult Not difficult at all Extremely dIfficult     . COVID-19 Education: The signs and symptoms of COVID-19 were discussed with the patient and how to seek care for testing if needed. The importance of social distancing was discussed today. . Reviewed expectations re: course of current medical issues. . Discussed self-management of  symptoms. . Outlined signs and symptoms indicating need for more acute intervention. . Patient verbalized understanding and all questions were answered. Marland Kitchen Health Maintenance issues including appropriate healthy diet, exercise, and smoking avoidance were discussed with patient. . See orders for this visit as documented in the electronic medical record.  Arnette Norris, MD  Records requested if needed. Time spent: 25 minutes, of which >50% was spent in obtaining information about her symptoms, reviewing her previous labs, evaluations, and treatments, counseling her about her condition (please see the discussed topics above), and developing a plan to further investigate it; she had a number of questions which I addressed.   Lab Results  Component Value Date   WBC 11.6 (H) 05/28/2016   HGB 12.3 05/28/2016   HCT 37.5 05/28/2016   PLT 322 05/28/2016   GLUCOSE 88 07/07/2017   ALT 17 07/07/2017   AST 17 07/07/2017   NA 142 07/07/2017   K 4.5 07/07/2017   CL 102 07/07/2017   CREATININE 0.71 07/07/2017   BUN  12 07/07/2017   CO2 25 07/07/2017   TSH 1.340 07/07/2017   HGBA1C 5.6 07/07/2017    Lab Results  Component Value Date   TSH 1.340 07/07/2017   Lab Results  Component Value Date   WBC 11.6 (H) 05/28/2016   HGB 12.3 05/28/2016   HCT 37.5 05/28/2016   MCV 93.9 05/28/2016   PLT 322 05/28/2016   Lab Results  Component Value Date   NA 142 07/07/2017   K 4.5 07/07/2017   CO2 25 07/07/2017   GLUCOSE 88 07/07/2017   BUN 12 07/07/2017   CREATININE 0.71 07/07/2017   BILITOT <0.2 07/07/2017   ALKPHOS 96 07/07/2017   AST 17 07/07/2017   ALT 17 07/07/2017   PROT 7.5 07/07/2017   ALBUMIN 4.2 07/07/2017   CALCIUM 9.2 07/07/2017   No results found for: CHOL No results found for: HDL No results found for: LDLCALC No results found for: TRIG No results found for: CHOLHDL Lab Results  Component Value Date   HGBA1C 5.6 07/07/2017       Assessment & Plan:   Problem List Items  Addressed This Visit      Active Problems   ANXIETY DEPRESSION - Primary    Overall doing well- increased job stressors.  She is pleased with zoloft and as needed xanax at current dose. eRx refills.  The patient indicates understanding of these issues and agrees with the plan.       Relevant Medications   ALPRAZolam (XANAX) 0.25 MG tablet   sertraline (ZOLOFT) 50 MG tablet    Other Visit Diagnoses    Anxiety and depression       Relevant Medications   ALPRAZolam (XANAX) 0.25 MG tablet   sertraline (ZOLOFT) 50 MG tablet      I have discontinued Hannah Stephenson's azithromycin and benzonatate. I have also changed her ALPRAZolam and sertraline.  Meds ordered this encounter  Medications  . ALPRAZolam (XANAX) 0.25 MG tablet    Sig: Take 1 tablet (0.25 mg total) by mouth 3 (three) times daily as needed.    Dispense:  30 tablet    Refill:  0  . sertraline (ZOLOFT) 50 MG tablet    Sig: Take 1 tablet (50 mg total) by mouth daily.    Dispense:  90 tablet    Refill:  0     Arnette Norris, MD

## 2019-11-29 ENCOUNTER — Encounter: Payer: Self-pay | Admitting: Family Medicine

## 2019-11-29 ENCOUNTER — Telehealth (INDEPENDENT_AMBULATORY_CARE_PROVIDER_SITE_OTHER): Payer: Self-pay | Admitting: Family Medicine

## 2019-11-29 VITALS — Ht 63.0 in | Wt 203.0 lb

## 2019-11-29 DIAGNOSIS — F32A Depression, unspecified: Secondary | ICD-10-CM

## 2019-11-29 DIAGNOSIS — F341 Dysthymic disorder: Secondary | ICD-10-CM

## 2019-11-29 DIAGNOSIS — F419 Anxiety disorder, unspecified: Secondary | ICD-10-CM

## 2019-11-29 DIAGNOSIS — F329 Major depressive disorder, single episode, unspecified: Secondary | ICD-10-CM

## 2019-11-29 DIAGNOSIS — J0100 Acute maxillary sinusitis, unspecified: Secondary | ICD-10-CM

## 2019-11-29 MED ORDER — AZITHROMYCIN 250 MG PO TABS: ORAL_TABLET | ORAL | 0 refills | Status: DC

## 2019-11-29 MED ORDER — ALPRAZOLAM 0.25 MG PO TABS: 0.2500 mg | ORAL_TABLET | Freq: Three times a day (TID) | ORAL | 0 refills | Status: DC | PRN

## 2019-11-29 MED ORDER — SERTRALINE HCL 50 MG PO TABS: 50.0000 mg | ORAL_TABLET | Freq: Every day | ORAL | 0 refills | Status: DC

## 2019-11-29 NOTE — Assessment & Plan Note (Signed)
Overall doing well- increased job stressors.  She is pleased with zoloft and as needed xanax at current dose. eRx refills.  The patient indicates understanding of these issues and agrees with the plan.

## 2020-08-14 ENCOUNTER — Other Ambulatory Visit: Payer: Self-pay

## 2020-08-15 ENCOUNTER — Encounter: Payer: Self-pay | Admitting: Nurse Practitioner

## 2020-08-15 ENCOUNTER — Ambulatory Visit (INDEPENDENT_AMBULATORY_CARE_PROVIDER_SITE_OTHER): Payer: Managed Care, Other (non HMO) | Admitting: Nurse Practitioner

## 2020-08-15 VITALS — BP 136/90 | HR 88 | Temp 97.7°F | Ht 62.0 in | Wt 230.2 lb

## 2020-08-15 DIAGNOSIS — F411 Generalized anxiety disorder: Secondary | ICD-10-CM

## 2020-08-15 MED ORDER — SERTRALINE HCL 50 MG PO TABS: 50.0000 mg | ORAL_TABLET | Freq: Every day | ORAL | 3 refills | Status: DC

## 2020-08-15 NOTE — Progress Notes (Signed)
   Subjective:  Patient ID: Hannah Stephenson, female    DOB: [birth year]  Age: 44 y.o. MRN: [id number]  CC: Follow-up (6 month f/u on anxiety and depression, refills needed.)  HPI  GAD (generalized anxiety disorder) Worsening mood due to lack of medication in last 2weeks. Had significant improvement with zoloft 50mg . Previous CBT which was helpful but does not need it at this time. Trigger: work stress, change in schedule, change in season Other ways she manages her mood: exercise, meditation, talking to her family. No SI/HI PHQ-9 7/30 and GAD 17/21 Resume zoloft 50mg  Continue exercise, heart healthy diet and adequate sleep. F/up in 3-2months or sooner if needed    Reviewed past Medical, Social and Family history today.  Outpatient Medications Prior to Visit  Medication Sig Dispense Refill  . ALPRAZolam (XANAX) 0.25 MG tablet Take 1 tablet (0.25 mg total) by mouth 3 (three) times daily as needed. 30 tablet 0  . sertraline (ZOLOFT) 50 MG tablet Take 1 tablet (50 mg total) by mouth daily. 90 tablet 0  . azithromycin (ZITHROMAX) 250 MG tablet 2 tabs po x 1 day then 1 tab po daily (Patient not taking: Reported on 08/15/2020) 6 tablet 0   No facility-administered medications prior to visit.    ROS See HPI  Objective:  BP 136/90 (BP Location: Left Arm, Patient Position: Sitting, Cuff Size: Normal)   Pulse 88   Temp 97.7 F (36.5 C) (Temporal)   Ht 5\' 2"  (1.575 m)   Wt 230 lb 3.2 oz (104.4 kg)   LMP 05/18/2016   SpO2 96%   BMI 42.10 kg/m   Physical Exam  Assessment & Plan:  This visit occurred during the SARS-CoV-2 public health emergency.  Safety protocols were in place, including screening questions prior to the visit, additional usage of staff PPE, and extensive cleaning of exam room while observing appropriate contact time as indicated for disinfecting solutions.   Hannah Stephenson was seen today for follow-up.  Diagnoses and all orders for this visit:  GAD (generalized anxiety  disorder) -     sertraline (ZOLOFT) 50 MG tablet; Take 1 tablet (50 mg total) by mouth daily.    Problem List Items Addressed This Visit      Other   GAD (generalized anxiety disorder) - Primary    Worsening mood due to lack of medication in last 2weeks. Had significant improvement with zoloft 50mg . Previous CBT which was helpful but does not need it at this time. Trigger: work stress, change in schedule, change in season Other ways she manages her mood: exercise, meditation, talking to her family. No SI/HI PHQ-9 7/30 and GAD 17/21 Resume zoloft 50mg  Continue exercise, heart healthy diet and adequate sleep. F/up in 3-42months or sooner if needed       Relevant Medications   sertraline (ZOLOFT) 50 MG tablet      Follow-up: Return in about 4 weeks (around 09/12/2020) for CPE (fasting).  Wilfred Lacy, NP

## 2020-08-15 NOTE — Assessment & Plan Note (Signed)
Worsening mood due to lack of medication in last 2weeks. Had significant improvement with zoloft 50mg . Previous CBT which was helpful but does not need it at this time. Trigger: work stress, change in schedule, change in season Other ways she manages her mood: exercise, meditation, talking to her family. No SI/HI PHQ-9 7/30 and GAD 17/21 Resume zoloft 50mg  Continue exercise, heart healthy diet and adequate sleep. F/up in 3-4months or sooner if needed

## 2020-08-15 NOTE — Patient Instructions (Signed)
Maintain upcoming appt for CPE (fasting)

## 2020-09-01 ENCOUNTER — Telehealth: Payer: Self-pay

## 2020-09-01 NOTE — Telephone Encounter (Signed)
Pt called an was concerned because she could actively see her pulse on the right side of her neck. I told her that we can sometimes see it, she stated she had a headache and that it could have a lot to do with it, pt was going to go home and check her BP and pulse rate on machine, Pt was given the normal ranges for Bp and pulse rate. I asked if she had any other symptoms and she said no just she never noticed it before. I told her to call the office if her numbers where not within normal ranges.

## 2020-09-16 ENCOUNTER — Encounter: Payer: Self-pay | Admitting: Nurse Practitioner

## 2020-10-17 ENCOUNTER — Encounter: Payer: Self-pay | Admitting: Nurse Practitioner

## 2020-10-31 ENCOUNTER — Encounter: Payer: Self-pay | Admitting: Nurse Practitioner

## 2020-10-31 ENCOUNTER — Telehealth: Payer: Self-pay | Admitting: Nurse Practitioner

## 2020-10-31 NOTE — Telephone Encounter (Signed)
Pt was no show for appt 10/17/2020 for transfer of care. 1st occurrence.  Letter mailed that additional no shows may result in dismissal.

## 2021-10-29 ENCOUNTER — Other Ambulatory Visit: Payer: Self-pay | Admitting: Nurse Practitioner

## 2021-10-29 DIAGNOSIS — F411 Generalized anxiety disorder: Secondary | ICD-10-CM

## 2021-11-10 ENCOUNTER — Other Ambulatory Visit: Payer: Self-pay

## 2021-11-10 ENCOUNTER — Ambulatory Visit (INDEPENDENT_AMBULATORY_CARE_PROVIDER_SITE_OTHER): Payer: Managed Care, Other (non HMO) | Admitting: Nurse Practitioner

## 2021-11-10 ENCOUNTER — Encounter: Payer: Self-pay | Admitting: Nurse Practitioner

## 2021-11-10 VITALS — BP 170/118 | HR 101 | Temp 96.7°F | Ht 62.0 in | Wt 240.0 lb

## 2021-11-10 DIAGNOSIS — Z13220 Encounter for screening for lipoid disorders: Secondary | ICD-10-CM | POA: Diagnosis not present

## 2021-11-10 DIAGNOSIS — J0100 Acute maxillary sinusitis, unspecified: Secondary | ICD-10-CM

## 2021-11-10 DIAGNOSIS — Z136 Encounter for screening for cardiovascular disorders: Secondary | ICD-10-CM | POA: Diagnosis not present

## 2021-11-10 DIAGNOSIS — F411 Generalized anxiety disorder: Secondary | ICD-10-CM | POA: Diagnosis not present

## 2021-11-10 DIAGNOSIS — R030 Elevated blood-pressure reading, without diagnosis of hypertension: Secondary | ICD-10-CM | POA: Diagnosis not present

## 2021-11-10 DIAGNOSIS — R739 Hyperglycemia, unspecified: Secondary | ICD-10-CM | POA: Diagnosis not present

## 2021-11-10 LAB — LIPID PANEL
Cholesterol: 233 mg/dL — ABNORMAL HIGH (ref 0–200)
HDL: 47.2 mg/dL (ref >39.00)
LDL Cholesterol: 151 mg/dL — ABNORMAL HIGH (ref 0–99)
NonHDL: 185.81
Total CHOL/HDL Ratio: 5
Triglycerides: 172 mg/dL — ABNORMAL HIGH (ref 0.0–149.0)
VLDL: 34.4 mg/dL (ref 0.0–40.0)

## 2021-11-10 LAB — BASIC METABOLIC PANEL
BUN: 12 mg/dL (ref 6–23)
CO2: 26 mEq/L (ref 19–32)
Calcium: 9.4 mg/dL (ref 8.4–10.5)
Chloride: 103 mEq/L (ref 96–112)
Creatinine, Ser: 0.78 mg/dL (ref 0.40–1.20)
GFR: 90.35 mL/min (ref >60.00)
Glucose, Bld: 95 mg/dL (ref 70–99)
Potassium: 3.6 mEq/L (ref 3.5–5.1)
Sodium: 138 mEq/L (ref 135–145)

## 2021-11-10 LAB — CBC
HCT: 39.8 % (ref 36.0–46.0)
Hemoglobin: 13 g/dL (ref 12.0–15.0)
MCHC: 32.7 g/dL (ref 30.0–36.0)
MCV: 91.9 fl (ref 78.0–100.0)
Platelets: 422 10*3/uL — ABNORMAL HIGH (ref 150.0–400.0)
RBC: 4.33 Mil/uL (ref 3.87–5.11)
RDW: 13.3 % (ref 11.5–15.5)
WBC: 9.6 10*3/uL (ref 4.0–10.5)

## 2021-11-10 LAB — HEMOGLOBIN A1C: Hgb A1c MFr Bld: 6 % (ref 4.6–6.5)

## 2021-11-10 LAB — TSH: TSH: 2.69 u[IU]/mL (ref 0.35–5.50)

## 2021-11-10 MED ORDER — AZITHROMYCIN 250 MG PO TABS: 250.0000 mg | ORAL_TABLET | Freq: Every day | ORAL | 0 refills | Status: DC

## 2021-11-10 MED ORDER — PREDNISONE 20 MG PO TABS: 20.0000 mg | ORAL_TABLET | Freq: Two times a day (BID) | ORAL | 0 refills | Status: DC

## 2021-11-10 MED ORDER — SERTRALINE HCL 50 MG PO TABS: 50.0000 mg | ORAL_TABLET | Freq: Every day | ORAL | 5 refills | Status: DC

## 2021-11-10 MED ORDER — LOSARTAN POTASSIUM 25 MG PO TABS
25.0000 mg | ORAL_TABLET | Freq: Every day | ORAL | 5 refills | Status: DC
Start: 2021-11-10 — End: 2021-11-25

## 2021-11-10 NOTE — Assessment & Plan Note (Signed)
Worsening due to lack of zoloft in last 17month. Denies any psychosis or SI.  Resume zoloft 50mg  and entered referral to psychology F/up in 2weeks

## 2021-11-10 NOTE — Patient Instructions (Addendum)
Do not take any OTC decongestant due to elevated BP. Ok to use mucinex, mucinex DM, antihistamine, or coricidin HBP if needed in further.  Start oral prednisone x 3days, then switch to flonase nasal spray. Start azithromycin. Ok to use saline nasal spray as needed to moisturize nasal passage. Maintain adequate oral hydration  Resume zoloft You will be contacted to schedule appt with psychology.  Start losartan for elevated BP Maintain DASH diet and daily low impact exercise.  Go to lab for blood draw.  DASH Eating Plan DASH stands for Dietary Approaches to Stop Hypertension. The DASH eating plan is a healthy eating plan that has been shown to: Reduce high blood pressure (hypertension). Reduce your risk for type 2 diabetes, heart disease, and stroke. Help with weight loss. What are tips for following this plan? Reading food labels Check food labels for the amount of salt (sodium) per serving. Choose foods with less than 5 percent of the Daily Value of sodium. Generally, foods with less than 300 milligrams (mg) of sodium per serving fit into this eating plan. To find whole grains, look for the word "whole" as the first word in the ingredient list. Shopping Buy products labeled as "low-sodium" or "no salt added." Buy fresh foods. Avoid canned foods and pre-made or frozen meals. Cooking Avoid adding salt when cooking. Use salt-free seasonings or herbs instead of table salt or sea salt. Check with your health care provider or pharmacist before using salt substitutes. Do not fry foods. Cook foods using healthy methods such as baking, boiling, grilling, roasting, and broiling instead. Cook with heart-healthy oils, such as olive, canola, avocado, soybean, or sunflower oil. Meal planning  Eat a balanced diet that includes: 4 or more servings of fruits and 4 or more servings of vegetables each day. Try to fill one-half of your plate with fruits and vegetables. 6-8 servings of whole grains  each day. Less than 6 oz (170 g) of lean meat, poultry, or fish each day. A 3-oz (85-g) serving of meat is about the same size as a deck of cards. One egg equals 1 oz (28 g). 2-3 servings of low-fat dairy each day. One serving is 1 cup (237 mL). 1 serving of nuts, seeds, or beans 5 times each week. 2-3 servings of heart-healthy fats. Healthy fats called omega-3 fatty acids are found in foods such as walnuts, flaxseeds, fortified milks, and eggs. These fats are also found in cold-water fish, such as sardines, salmon, and mackerel. Limit how much you eat of: Canned or prepackaged foods. Food that is high in trans fat, such as some fried foods. Food that is high in saturated fat, such as fatty meat. Desserts and other sweets, sugary drinks, and other foods with added sugar. Full-fat dairy products. Do not salt foods before eating. Do not eat more than 4 egg yolks a week. Try to eat at least 2 vegetarian meals a week. Eat more home-cooked food and less restaurant, buffet, and fast food. Lifestyle When eating at a restaurant, ask that your food be prepared with less salt or no salt, if possible. If you drink alcohol: Limit how much you use to: 0-1 drink a day for women who are not pregnant. 0-2 drinks a day for men. Be aware of how much alcohol is in your drink. In the [place] one drink equals one 12 oz bottle of beer (355 mL), one 5 oz glass of wine (148 mL), or one 1 oz glass of hard liquor (44 mL). General information  Avoid eating more than 2,300 mg of salt a day. If you have hypertension, you may need to reduce your sodium intake to 1,500 mg a day. Work with your health care provider to maintain a healthy body weight or to lose weight. Ask what an ideal weight is for you. Get at least 30 minutes of exercise that causes your heart to beat faster (aerobic exercise) most days of the week. Activities may include walking, swimming, or biking. Work with your health care provider or dietitian to  adjust your eating plan to your individual calorie needs. What foods should I eat? Fruits All fresh, dried, or frozen fruit. Canned fruit in natural juice (without added sugar). Vegetables Fresh or frozen vegetables (raw, steamed, roasted, or grilled). Low-sodium or reduced-sodium tomato and vegetable juice. Low-sodium or reduced-sodium tomato sauce and tomato paste. Low-sodium or reduced-sodium canned vegetables. Grains Whole-grain or whole-wheat bread. Whole-grain or whole-wheat pasta. Brown rice. [place]. Bulgur. Whole-grain and low-sodium cereals. Pita bread. Low-fat, low-sodium crackers. Whole-wheat flour tortillas. Meats and other proteins Skinless chicken or [place]. Ground chicken or [place]. Pork with fat trimmed off. Fish and seafood. Egg whites. Dried beans, peas, or lentils. Unsalted nuts, nut butters, and seeds. Unsalted canned beans. Lean cuts of beef with fat trimmed off. Low-sodium, lean precooked or cured meat, such as sausages or meat loaves. Dairy Low-fat (1%) or fat-free (skim) milk. Reduced-fat, low-fat, or fat-free cheeses. Nonfat, low-sodium ricotta or cottage cheese. Low-fat or nonfat yogurt. Low-fat, low-sodium cheese. Fats and oils Soft margarine without trans fats. Vegetable oil. Reduced-fat, low-fat, or light mayonnaise and salad dressings (reduced-sodium). Canola, safflower, olive, avocado, soybean, and sunflower oils. Avocado. Seasonings and condiments Herbs. Spices. Seasoning mixes without salt. Other foods Unsalted popcorn and pretzels. Fat-free sweets. The items listed above may not be a complete list of foods and beverages you can eat. Contact a dietitian for more information. What foods should I avoid? Fruits Canned fruit in a light or heavy syrup. Fried fruit. Fruit in cream or butter sauce. Vegetables Creamed or fried vegetables. Vegetables in a cheese sauce. Regular canned vegetables (not low-sodium or reduced-sodium). Regular canned tomato sauce and  paste (not low-sodium or reduced-sodium). Regular tomato and vegetable juice (not low-sodium or reduced-sodium). Angie Fava. Olives. Grains Baked goods made with fat, such as croissants, muffins, or some breads. Dry pasta or rice meal packs. Meats and other proteins Fatty cuts of meat. Ribs. Fried meat. Berniece Salines. Bologna, salami, and other precooked or cured meats, such as sausages or meat loaves. Fat from the back of a pig (fatback). Bratwurst. Salted nuts and seeds. Canned beans with added salt. Canned or smoked fish. Whole eggs or egg yolks. Chicken or [place] with skin. Dairy Whole or 2% milk, cream, and half-and-half. Whole or full-fat cream cheese. Whole-fat or sweetened yogurt. Full-fat cheese. Nondairy creamers. Whipped toppings. Processed cheese and cheese spreads. Fats and oils Butter. Stick margarine. Lard. Shortening. Ghee. Bacon fat. Tropical oils, such as coconut, palm kernel, or palm oil. Seasonings and condiments Onion salt, garlic salt, seasoned salt, table salt, and sea salt. Worcestershire sauce. Tartar sauce. Barbecue sauce. Teriyaki sauce. Soy sauce, including reduced-sodium. Steak sauce. Canned and packaged gravies. Fish sauce. Oyster sauce. Cocktail sauce. Store-bought horseradish. Ketchup. Mustard. Meat flavorings and tenderizers. Bouillon cubes. Hot sauces. Pre-made or packaged marinades. Pre-made or packaged taco seasonings. Relishes. Regular salad dressings. Other foods Salted popcorn and pretzels. The items listed above may not be a complete list of foods and beverages you should avoid. Contact a dietitian for  more information. Where to find more information National Heart, Lung, and Blood Institute: https://wilson-eaton.com/ American Heart Association: www.heart.org Academy of Nutrition and Dietetics: www.eatright.[employer]: www.kidney.org Summary The DASH eating plan is a healthy eating plan that has been shown to reduce high blood pressure (hypertension). It  may also reduce your risk for type 2 diabetes, heart disease, and stroke. When on the DASH eating plan, aim to eat more fresh fruits and vegetables, whole grains, lean proteins, low-fat dairy, and heart-healthy fats. With the DASH eating plan, you should limit salt (sodium) intake to 2,300 mg a day. If you have hypertension, you may need to reduce your sodium intake to 1,500 mg a day. Work with your health care provider or dietitian to adjust your eating plan to your individual calorie needs. This information is not intended to replace advice given to you by your health care provider. Make sure you discuss any questions you have with your health care provider. Document Revised: 11/02/2019 Document Reviewed: 11/02/2019 Elsevier Patient Education  2022 [employer].

## 2021-11-10 NOTE — Progress Notes (Signed)
Subjective:  Patient ID: Hannah Stephenson, female    DOB: [birth year]  Age: 45 y.o. MRN: [id number]  CC: Acute Visit (Pt c/o elevated BP, stating she she has been off of her Xanax for over a year and thinks this is contributing to her elevated BP. This past Sunday her BP was around 150s/109. Pt states her job has been more stressful than usual and states that normally when she would be anxious she would take a Xanax and now that she does not have that it has been difficult. Denies headaches or dizziness. /Declines all vaccines)  Sinusitis This is a new problem. The current episode started 1 to 4 weeks ago. The problem is unchanged. There has been no fever. Associated symptoms include congestion, coughing, headaches and sinus pressure. Pertinent negatives include no chills, diaphoresis, ear pain, hoarse voice, neck pain, shortness of breath, sneezing, sore throat or swollen glands. Past treatments include acetaminophen and oral decongestants. The treatment provided no relief.    ELEVATED BP READING WITHOUT DX HYPERTENSION Home BP reading in last 1week: 175/119, 154/107. Associated with headache Has been taking tylenol cold and sinus x 2weeks. Reports she is working on low sodium diet and has eliminated soda consumption. elevated BP could be related to OTC medication, but has hx of elevated DBP. BP Readings from Last 3 Encounters:  11/10/21 (!) 170/118  08/15/20 136/90  05/17/18 124/90   Advised to avoid OTC decongestants, maintain DASh diet and adequate oral hydration with water. Check TSH, cbc and BMP Start losartan 25mg  daily F/up in 2weeks  GAD (generalized anxiety disorder) Worsening due to lack of zoloft in last 37month. Denies any psychosis or SI.  Resume zoloft 50mg  and entered referral to psychology F/up in 2weeks  BP Readings from Last 3 Encounters:  11/10/21 (!) 170/118  08/15/20 136/90  05/17/18 124/90    Depression screen PHQ 2/9 11/10/2021 08/15/2020 11/29/2019  Decreased  Interest 2 1 0  Down, Depressed, Hopeless 0 1 0  PHQ - 2 Score 2 2 0  Altered sleeping 1 2 1   Tired, decreased energy 1 2 3   Change in appetite 2 0 1  Feeling bad or failure about yourself  2 0 1  Trouble concentrating 1 1 0  Moving slowly or fidgety/restless 0 0 0  Suicidal thoughts 0 0 0  PHQ-9 Score 9 7 6   Difficult doing work/chores Somewhat difficult Somewhat difficult Somewhat difficult    GAD 7 : Generalized Anxiety Score 11/10/2021 08/15/2020 03/07/2019 05/02/2018  Nervous, Anxious, on Edge 2 3 1 3   Control/stop worrying 2 2 2 3   Worry too much - different things 2 2 0 3  Trouble relaxing 2 3 1 2   Restless 1 1 0 1  Easily annoyed or irritable 2 3 1 3   Afraid - awful might happen 2 1 0 2  Total GAD 7 Score 13 15 5 17   Anxiety Difficulty Somewhat difficult Very difficult Not difficult at all Extremely difficult     Reviewed past Medical, Social and Family history today.  Outpatient Medications Prior to Visit  Medication Sig Dispense Refill   ALPRAZolam (XANAX) 0.25 MG tablet Take 1 tablet (0.25 mg total) by mouth 3 (three) times daily as needed. 30 tablet 0   sertraline (ZOLOFT) 50 MG tablet Take 1 tablet (50 mg total) by mouth daily. 90 tablet 3   azithromycin (ZITHROMAX) 250 MG tablet 2 tabs po x 1 day then 1 tab po daily (Patient not taking: Reported on 08/15/2020) 6  tablet 0   No facility-administered medications prior to visit.    ROS See HPI  Objective:  BP (!) 170/118 (BP Location: Left Arm, Patient Position: Sitting, Cuff Size: Large)   Pulse (!) 101   Temp (!) 96.7 F (35.9 C) (Temporal)   Ht 5\' 2"  (1.575 m)   Wt 240 lb (108.9 kg)   LMP 05/18/2016   SpO2 98%   BMI 43.90 kg/m   Physical Exam HENT:     Head:     Jaw: There is normal jaw occlusion.     Salivary Glands: Right salivary gland is not diffusely enlarged or tender. Left salivary gland is not diffusely enlarged or tender.     Right Ear: Tympanic membrane, ear canal and external ear normal.      Left Ear: Ear canal and external ear normal.     Nose: Mucosal edema and congestion present. No rhinorrhea.     Right Nostril: No occlusion.     Left Nostril: No occlusion.     Right Turbinates: Enlarged and swollen.     Left Turbinates: Enlarged and swollen.     Right Sinus: Maxillary sinus tenderness and frontal sinus tenderness present.     Left Sinus: Maxillary sinus tenderness and frontal sinus tenderness present.  Cardiovascular:     Rate and Rhythm: Normal rate and regular rhythm.     Pulses: Normal pulses.     Heart sounds: Normal heart sounds.  Pulmonary:     Effort: Pulmonary effort is normal.     Breath sounds: Normal breath sounds.  Musculoskeletal:     Cervical back: Normal range of motion and neck supple.     Right lower leg: No edema.     Left lower leg: No edema.  Lymphadenopathy:     Cervical: No cervical adenopathy.  Neurological:     Mental Status: She is alert and oriented to person, place, and time.  Psychiatric:        Attention and Perception: Attention normal.        Mood and Affect: Mood is anxious.        Speech: Speech normal.        Behavior: Behavior is cooperative.        Thought Content: Thought content is not paranoid or delusional. Thought content does not include homicidal or suicidal ideation. Thought content does not include homicidal or suicidal plan.        Cognition and Memory: Cognition and memory normal.   Assessment & Plan:  This visit occurred during the SARS-CoV-2 public health emergency.  Safety protocols were in place, including screening questions prior to the visit, additional usage of staff PPE, and extensive cleaning of exam room while observing appropriate contact time as indicated for disinfecting solutions.   Hannah Stephenson was seen today for acute visit.  Diagnoses and all orders for this visit:  ELEVATED BP READING WITHOUT DX HYPERTENSION -     TSH -     Basic metabolic panel -     CBC -     losartan (COZAAR) 25 MG tablet; Take  1 tablet (25 mg total) by mouth daily.  Acute non-recurrent maxillary sinusitis -     predniSONE (DELTASONE) 20 MG tablet; Take 1 tablet (20 mg total) by mouth 2 (two) times daily with a meal. -     azithromycin (ZITHROMAX Z-PAK) 250 MG tablet; Take 1 tablet (250 mg total) by mouth daily. Take 2tabs on first day, then 1tab once a day till complete  GAD (generalized anxiety disorder) -     Ambulatory referral to Psychology -     sertraline (ZOLOFT) 50 MG tablet; Take 1 tablet (50 mg total) by mouth daily.  Hyperglycemia -     Hemoglobin A1c  Encounter for lipid screening for cardiovascular disease -     Lipid panel  Do not take any OTC decongestant due to elevated BP. Ok to use mucinex, mucinex DM, antihistamine, or coricidin HBP if needed in further. Start oral prednisone x 3days, then switch to flonase nasal spray. Start azithromycin. Ok to use saline nasal spray as needed to moisturize nasal passage. Maintain adequate oral hydration Resume zoloft You will be contacted to schedule appt with psychology. Start losartan for elevated BP Maintain DASH diet and daily low impact exercise.  Problem List Items Addressed This Visit       Respiratory   Acute sinusitis   Relevant Medications   predniSONE (DELTASONE) 20 MG tablet   azithromycin (ZITHROMAX Z-PAK) 250 MG tablet     Other   ELEVATED BP READING WITHOUT DX HYPERTENSION - Primary    Home BP reading in last 1week: 175/119, 154/107. Associated with headache Has been taking tylenol cold and sinus x 2weeks. Reports she is working on low sodium diet and has eliminated soda consumption. elevated BP could be related to OTC medication, but has hx of elevated DBP. BP Readings from Last 3 Encounters:  11/10/21 (!) 170/118  08/15/20 136/90  05/17/18 124/90   Advised to avoid OTC decongestants, maintain DASh diet and adequate oral hydration with water. Check TSH, cbc and BMP Start losartan 25mg  daily F/up in 2weeks       Relevant Medications   losartan (COZAAR) 25 MG tablet   Other Relevant Orders   TSH   Basic metabolic panel   CBC   GAD (generalized anxiety disorder)    Worsening due to lack of zoloft in last 80month. Denies any psychosis or SI.  Resume zoloft 50mg  and entered referral to psychology F/up in 2weeks      Relevant Medications   sertraline (ZOLOFT) 50 MG tablet   Other Relevant Orders   Ambulatory referral to Psychology   Other Visit Diagnoses     Hyperglycemia       Relevant Orders   Hemoglobin A1c   Encounter for lipid screening for cardiovascular disease       Relevant Orders   Lipid panel       Follow-up: Return in about 2 weeks (around 11/24/2021) for HTN and anxiety.  Wilfred Lacy, NP

## 2021-11-10 NOTE — Assessment & Plan Note (Addendum)
Home BP reading in last 1week: 175/119, 154/107. Associated with headache Has been taking tylenol cold and sinus x 2weeks. Reports she is working on low sodium diet and has eliminated soda consumption. elevated BP could be related to OTC medication, but has hx of elevated DBP. BP Readings from Last 3 Encounters:  11/10/21 (!) 170/118  08/15/20 136/90  05/17/18 124/90   Advised to avoid OTC decongestants, maintain DASh diet and adequate oral hydration with water. Check TSH, cbc and BMP Start losartan 25mg  daily F/up in 2weeks

## 2021-11-11 ENCOUNTER — Telehealth: Payer: Self-pay

## 2021-11-11 NOTE — Telephone Encounter (Signed)
Spoke with patient on 11/10/21 in regard to her elevated BP, encouraged patient to go to pharmacy and start medication because she had not picked it up after her appointment that morning. Confirmed with patient she did not have any chest pain, headaches, or dizziness and informed her if she did start to experience any of these symptoms to please go to her local ED or urgent care. Pt verbalized understanding.

## 2021-11-11 NOTE — Telephone Encounter (Signed)
Duplicate, handled during patient appointment on 11/10/21

## 2021-11-25 ENCOUNTER — Encounter: Payer: Self-pay | Admitting: Nurse Practitioner

## 2021-11-25 ENCOUNTER — Other Ambulatory Visit: Payer: Self-pay

## 2021-11-25 ENCOUNTER — Ambulatory Visit (INDEPENDENT_AMBULATORY_CARE_PROVIDER_SITE_OTHER): Payer: Managed Care, Other (non HMO) | Admitting: Nurse Practitioner

## 2021-11-25 VITALS — BP 144/92 | HR 98 | Temp 96.7°F | Ht 62.0 in | Wt 237.0 lb

## 2021-11-25 DIAGNOSIS — R030 Elevated blood-pressure reading, without diagnosis of hypertension: Secondary | ICD-10-CM

## 2021-11-25 DIAGNOSIS — F411 Generalized anxiety disorder: Secondary | ICD-10-CM

## 2021-11-25 DIAGNOSIS — I10 Essential (primary) hypertension: Secondary | ICD-10-CM

## 2021-11-25 MED ORDER — SERTRALINE HCL 50 MG PO TABS: 50.0000 mg | ORAL_TABLET | Freq: Every day | ORAL | 3 refills | Status: DC

## 2021-11-25 MED ORDER — LOSARTAN POTASSIUM 25 MG PO TABS
25.0000 mg | ORAL_TABLET | Freq: Two times a day (BID) | ORAL | 3 refills | Status: DC
Start: 2021-11-25 — End: 2022-04-22

## 2021-11-25 NOTE — Assessment & Plan Note (Signed)
Improved mood with zoloft Plans to schedule appt with therapist  Maintain med dose F/up in 12months

## 2021-11-25 NOTE — Assessment & Plan Note (Signed)
Improved BP but not at goal Home BP reading 130s/80s-140s/90s Improved headache with avoiding processed and fast food. She has changed diet to heart healthy and low sodium. She has also started daily exercise: walking 39mins on treadmill and increasing daily steps at work. BP Readings from Last 3 Encounters:  11/25/21 (!) 144/92  11/10/21 (!) 170/118  08/15/20 136/90   Increased losartan dose to 25mg  BID Maintain lifestyle modification New rx sent F/up in 39months or sooner if needed

## 2021-11-25 NOTE — Progress Notes (Signed)
Subjective:  Patient ID: Hannah Stephenson, female    DOB: 03-11-1976  Age: 45 y.o. MRN: [id number]  CC: Follow-up (2 week f/u on HTN and anxiety. Pt states she has noticed a decrease in her anxiety and states that she has been feeling a little better. )  HPI  HTN (hypertension) Improved BP but not at goal Home BP reading 130s/80s-140s/90s Improved headache with avoiding processed and fast food. She has changed diet to heart healthy and low sodium. She has also started daily exercise: walking 70mins on treadmill and increasing daily steps at work. BP Readings from Last 3 Encounters:  11/25/21 (!) 144/92  11/10/21 (!) 170/118  08/15/20 136/90   Increased losartan dose to 25mg  BID Maintain lifestyle modification New rx sent F/up in 80months or sooner if needed  GAD (generalized anxiety disorder) Improved mood with zoloft Plans to schedule appt with therapist  Maintain med dose F/up in 73months  Depression screen [facility] 2/9 11/25/2021 11/10/2021 08/15/2020  Decreased Interest 1 2 1   Down, Depressed, Hopeless 1 0 1  PHQ - 2 Score 2 2 2   Altered sleeping 0 1 2  Tired, decreased energy 1 1 2   Change in appetite 0 2 0  Feeling bad or failure about yourself  0 2 0  Trouble concentrating 0 1 1  Moving slowly or fidgety/restless 0 0 0  Suicidal thoughts 0 0 0  PHQ-9 Score 3 9 7   Difficult doing work/chores Not difficult at all Somewhat difficult Somewhat difficult    GAD 7 : Generalized Anxiety Score 11/25/2021 11/10/2021 08/15/2020 03/07/2019  Nervous, Anxious, on Edge 1 2 3 1   Control/stop worrying 2 2 2 2   Worry too much - different things 1 2 2  0  Trouble relaxing 0 2 3 1   Restless 1 1 1  0  Easily annoyed or irritable 0 2 3 1   Afraid - awful might happen 0 2 1 0  Total GAD 7 Score 5 13 15 5   Anxiety Difficulty Somewhat difficult Somewhat difficult Very difficult Not difficult at all   Reviewed past Medical, Social and Family history today.  Outpatient Medications Prior to  Visit  Medication Sig Dispense Refill   ALPRAZolam (XANAX) 0.25 MG tablet Take 1 tablet (0.25 mg total) by mouth 3 (three) times daily as needed. 30 tablet 0   azithromycin (ZITHROMAX Z-PAK) 250 MG tablet Take 1 tablet (250 mg total) by mouth daily. Take 2tabs on first day, then 1tab once a day till complete 6 tablet 0   losartan (COZAAR) 25 MG tablet Take 1 tablet (25 mg total) by mouth daily. 30 tablet 5   predniSONE (DELTASONE) 20 MG tablet Take 1 tablet (20 mg total) by mouth 2 (two) times daily with a meal. 6 tablet 0   sertraline (ZOLOFT) 50 MG tablet Take 1 tablet (50 mg total) by mouth daily. 30 tablet 5   No facility-administered medications prior to visit.    ROS See HPI  Objective:  BP (!) 144/92 (BP Location: Left Arm, Patient Position: Sitting, Cuff Size: Large)    Pulse 98    Temp (!) 96.7 F (35.9 C) (Temporal)    Ht 5\' 2"  (1.575 m)    Wt 237 lb (107.5 kg)    LMP 05/18/2016    SpO2 100%    BMI 43.35 kg/m   Physical Exam Cardiovascular:     Rate and Rhythm: Normal rate.     Pulses: Normal pulses.  Pulmonary:     Effort: Pulmonary effort  is normal.  Musculoskeletal:     Right lower leg: No edema.     Left lower leg: No edema.  Neurological:     Mental Status: She is alert.   Assessment & Plan:  This visit occurred during the SARS-CoV-2 public health emergency.  Safety protocols were in place, including screening questions prior to the visit, additional usage of staff PPE, and extensive cleaning of exam room while observing appropriate contact time as indicated for disinfecting solutions.   Siarah was seen today for follow-up.  Diagnoses and all orders for this visit:  GAD (generalized anxiety disorder) -     sertraline (ZOLOFT) 50 MG tablet; Take 1 tablet (50 mg total) by mouth daily.  Primary hypertension  ELEVATED BP READING WITHOUT DX HYPERTENSION -     losartan (COZAAR) 25 MG tablet; Take 1 tablet (25 mg total) by mouth 2 (two) times daily.   Problem  List Items Addressed This Visit       Cardiovascular and Mediastinum   HTN (hypertension)    Improved BP but not at goal Home BP reading 130s/80s-140s/90s Improved headache with avoiding processed and fast food. She has changed diet to heart healthy and low sodium. She has also started daily exercise: walking 57mins on treadmill and increasing daily steps at work. BP Readings from Last 3 Encounters:  11/25/21 (!) 144/92  11/10/21 (!) 170/118  08/15/20 136/90   Increased losartan dose to 25mg  BID Maintain lifestyle modification New rx sent F/up in 85months or sooner if needed      Relevant Medications   losartan (COZAAR) 25 MG tablet     Other   GAD (generalized anxiety disorder) - Primary    Improved mood with zoloft Plans to schedule appt with therapist  Maintain med dose F/up in 31months      Relevant Medications   sertraline (ZOLOFT) 50 MG tablet   Other Visit Diagnoses     ELEVATED BP READING WITHOUT DX HYPERTENSION       Relevant Medications   losartan (COZAAR) 25 MG tablet       Follow-up: Return in about 3 months (around 02/23/2022) for CPE (fasting).  Wilfred Lacy, NP

## 2021-11-25 NOTE — Patient Instructions (Signed)
Increase losartan to 25mg  BID Maintain zoloft dose Continue heart healthy diet and daily exercise.

## 2022-02-26 ENCOUNTER — Encounter: Payer: Managed Care, Other (non HMO) | Admitting: Nurse Practitioner

## 2022-02-26 ENCOUNTER — Telehealth: Payer: Self-pay | Admitting: Nurse Practitioner

## 2022-02-26 NOTE — Telephone Encounter (Signed)
Patient/Caregiver was notified of No Show/Late Cancellation Policy & possible $95 charge. ?Visit was cancelled with reason "No Show/Cancel within 24 hours" for tracking & charging. ? ?Caller Name: Kimbly Eanes ?Caller Ph #: [phone] ?Date of APPT: 02/26/22 ?Reason given for no show/late cancellation: no reason no show ?No Show Letter printed & put in outgoing mail (Yes/No): yes ? ?~~~Route message to admin supervisor and clinical team/CMA~~~ ? ? ? ?

## 2022-03-10 NOTE — Telephone Encounter (Signed)
2nd no show, generated fee, letter sent KO ?

## 2022-04-05 ENCOUNTER — Encounter: Payer: Self-pay | Admitting: Family Medicine

## 2022-04-05 ENCOUNTER — Telehealth (INDEPENDENT_AMBULATORY_CARE_PROVIDER_SITE_OTHER): Payer: Managed Care, Other (non HMO) | Admitting: Family Medicine

## 2022-04-05 VITALS — Temp 97.4°F

## 2022-04-05 DIAGNOSIS — R0981 Nasal congestion: Secondary | ICD-10-CM | POA: Diagnosis not present

## 2022-04-05 DIAGNOSIS — R051 Acute cough: Secondary | ICD-10-CM

## 2022-04-05 MED ORDER — FLUTICASONE PROPIONATE 50 MCG/ACT NA SUSP: 2.0000 | Freq: Every day | NASAL | 6 refills | Status: AC

## 2022-04-05 MED ORDER — BENZONATATE 100 MG PO CAPS: 100.0000 mg | ORAL_CAPSULE | Freq: Two times a day (BID) | ORAL | 0 refills | Status: DC | PRN

## 2022-04-05 NOTE — Progress Notes (Deleted)
? ?  Acute Office Visit ? ?Subjective:  ? ?  ?Patient ID: Hannah Stephenson, female    DOB: [birth year], 46 y.o.   MRN: [id number] ? ?Chief Complaint  ?Patient presents with  ?? Acute Visit  ?  Pt c/o congestion ,fever, pressure in cheek bone, sneezing and coughing. X 2 weeks.Mucinex cough drops tylenol for fever.  ? ? ?Cough ?This is a new problem. Episode onset: 2 weeks. The problem has been unchanged. The problem occurs constantly. The cough is Productive of sputum. Associated symptoms include ear congestion, a fever (subjective), nasal congestion, postnasal drip and rhinorrhea. Pertinent negatives include no chest pain, chills, ear pain, headaches, heartburn, hemoptysis, myalgias, rash, sore throat, shortness of breath, sweats, weight loss or wheezing. Nothing aggravates the symptoms. She has tried OTC cough suppressant (mucinex, tylenol cold and flu, coricidin, afrin) for the symptoms. The treatment provided mild relief. There is no history of asthma, bronchiectasis or COPD.  ?Patient is in today for *** ? ?Review of Systems  ?Constitutional:  Positive for fever (subjective). Negative for chills and weight loss.  ?HENT:  Positive for postnasal drip and rhinorrhea. Negative for ear pain and sore throat.   ?Respiratory:  Positive for cough. Negative for hemoptysis, shortness of breath and wheezing.   ?Cardiovascular:  Negative for chest pain.  ?Gastrointestinal:  Negative for heartburn.  ?Musculoskeletal:  Negative for myalgias.  ?Skin:  Negative for rash.  ?Neurological:  Negative for headaches.  ? ? ?   ?Objective:  ?  ?Temp (!) 97.4 ?F (36.3 ?C) (Oral)   LMP 05/18/2016  ?{Vitals History (Optional):23777} ? ?Physical Exam ? ?No results found for any visits on 04/05/22. ? ? ?   ?Assessment & Plan:  ? ?Problem List Items Addressed This Visit   ?None ?Visit Diagnoses   ? ? Acute cough    -  Primary  ? Sinus congestion      ? ?  ? ? ?Meds ordered this encounter  ?Medications  ?? fluticasone (FLONASE) 50 MCG/ACT nasal  spray  ?  Sig: Place 2 sprays into both nostrils daily.  ?  Dispense:  16 g  ?  Refill:  6  ?? benzonatate (TESSALON) 100 MG capsule  ?  Sig: Take 1 capsule (100 mg total) by mouth 2 (two) times daily as needed for cough.  ?  Dispense:  20 capsule  ?  Refill:  0  ? ? ?No follow-ups on file. ? ?Bonnita Hollow, MD ? ? ?

## 2022-04-05 NOTE — Progress Notes (Signed)
Virtual Visit via Video Note ? ?I connected with Hannah Stephenson on 04/05/22 at  1:20 PM EDT by a video enabled telemedicine application and verified that I am speaking with the correct person using two identifiers. ? ?Location: ?Patient: home ?Provider: office ?  ?I discussed the limitations of evaluation and management by telemedicine and the availability of in person appointments. The patient expressed understanding and agreed to proceed. ? ?History of Present Illness: ?Cough ?This is a new problem. Episode onset: 2 weeks. The problem has been unchanged. The problem occurs constantly. The cough is Productive of sputum. Associated symptoms include ear congestion, a fever (subjective), nasal congestion, postnasal drip and rhinorrhea. Pertinent negatives include no chest pain, chills, ear pain, headaches, heartburn, hemoptysis, myalgias, rash, sore throat, shortness of breath, sweats, weight loss or wheezing. Nothing aggravates the symptoms. She has tried OTC cough suppressant (mucinex, tylenol cold and flu, coricidin, afrin) for the symptoms. The treatment provided mild relief. There is no history of asthma, bronchiectasis or COPD.  ? ? ?Review of Systems  ?Constitutional:  Positive for fever (subjective). Negative for chills and weight loss.  ?HENT:  Positive for postnasal drip and rhinorrhea. Negative for ear pain and sore throat.   ?Respiratory:  Positive for cough. Negative for hemoptysis, shortness of breath and wheezing.   ?Cardiovascular:  Negative for chest pain.  ?Gastrointestinal:  Negative for heartburn.  ?Musculoskeletal:  Negative for myalgias.  ?Skin:  Negative for rash.  ?Neurological:  Negative for headaches.  ?  ?Observations/Objective: ?General: No acute distress ?HEENT: Rhinorrhea, conjunctival injection ?Respiratory: No acute respiratory distress ? ?Assessment and Plan: ?Cough and congestion ?Viral versus bacterial etiology ?Recommend supportive care with Flonase and benzonatate as  prescribed ? ?Follow Up Instructions: ? ?  ?I discussed the assessment and treatment plan with the patient. The patient was provided an opportunity to ask questions and all were answered. The patient agreed with the plan and demonstrated an understanding of the instructions. ?  ?The patient was advised to call back or seek an in-person evaluation if the symptoms worsen or if the condition fails to improve as anticipated. ? ?I provided 10 minutes of non-face-to-face time during this encounter. ? ? ?Bonnita Hollow, MD ? ?

## 2022-04-05 NOTE — Patient Instructions (Signed)
He is Flonase and Best boy as prescribed ?

## 2022-04-19 ENCOUNTER — Telehealth: Payer: Self-pay | Admitting: Nurse Practitioner

## 2022-04-19 NOTE — Telephone Encounter (Signed)
Appt made for TOC ?

## 2022-04-19 NOTE — Telephone Encounter (Signed)
Pt called and is requesting to transfer care from [facility] to [facility], [place] is a lot closer for her. Is this okay? ?

## 2022-04-22 ENCOUNTER — Ambulatory Visit (INDEPENDENT_AMBULATORY_CARE_PROVIDER_SITE_OTHER): Payer: Managed Care, Other (non HMO) | Admitting: Family

## 2022-04-22 ENCOUNTER — Encounter: Payer: Self-pay | Admitting: Family

## 2022-04-22 VITALS — BP 148/102 | HR 95 | Temp 98.2°F | Resp 16 | Ht 62.0 in | Wt 240.3 lb

## 2022-04-22 DIAGNOSIS — R350 Frequency of micturition: Secondary | ICD-10-CM | POA: Diagnosis not present

## 2022-04-22 DIAGNOSIS — F419 Anxiety disorder, unspecified: Secondary | ICD-10-CM | POA: Diagnosis not present

## 2022-04-22 DIAGNOSIS — Z6841 Body Mass Index (BMI) 40.0 and over, adult: Secondary | ICD-10-CM

## 2022-04-22 DIAGNOSIS — R7989 Other specified abnormal findings of blood chemistry: Secondary | ICD-10-CM | POA: Diagnosis not present

## 2022-04-22 DIAGNOSIS — D75839 Thrombocytosis, unspecified: Secondary | ICD-10-CM | POA: Insufficient documentation

## 2022-04-22 DIAGNOSIS — E782 Mixed hyperlipidemia: Secondary | ICD-10-CM | POA: Insufficient documentation

## 2022-04-22 DIAGNOSIS — R7303 Prediabetes: Secondary | ICD-10-CM

## 2022-04-22 DIAGNOSIS — R5383 Other fatigue: Secondary | ICD-10-CM

## 2022-04-22 DIAGNOSIS — I10 Essential (primary) hypertension: Secondary | ICD-10-CM | POA: Diagnosis not present

## 2022-04-22 DIAGNOSIS — F32A Depression, unspecified: Secondary | ICD-10-CM

## 2022-04-22 DIAGNOSIS — J0100 Acute maxillary sinusitis, unspecified: Secondary | ICD-10-CM

## 2022-04-22 MED ORDER — SERTRALINE HCL 100 MG PO TABS: 100.0000 mg | ORAL_TABLET | Freq: Every day | ORAL | 0 refills | Status: DC

## 2022-04-22 MED ORDER — ALPRAZOLAM 0.25 MG PO TABS: 0.2500 mg | ORAL_TABLET | Freq: Every day | ORAL | 0 refills | Status: AC | PRN

## 2022-04-22 MED ORDER — CEFDINIR 300 MG PO CAPS: 300.0000 mg | ORAL_CAPSULE | Freq: Two times a day (BID) | ORAL | 0 refills | Status: AC

## 2022-04-22 MED ORDER — LOSARTAN POTASSIUM 50 MG PO TABS: 50.0000 mg | ORAL_TABLET | Freq: Every day | ORAL | 1 refills | Status: DC

## 2022-04-22 NOTE — Assessment & Plan Note (Signed)
Work on diet and exercise as tolerated  ?

## 2022-04-22 NOTE — Assessment & Plan Note (Signed)
rx cefdinir 300 mg po bid x 7 days ?Pt states allergy to pcn when in her 20's, more intolerance. Denies sob or chest pain with it. Only rash/itching ?Pt to continue tylenol/ibuprofen prn sinus pain. Continue with humidifier prn and steam showers recommended as well. instructed If no symptom improvement in 48 hours please f/u ? ?

## 2022-04-22 NOTE — Progress Notes (Signed)
? ?Established Patient Office Visit ? ?Subjective:  ?Patient ID: Hannah Stephenson, female    DOB: 1976/08/28  Age: 46 y.o. MRN: [id number] ? ?CC:  ?Chief Complaint  ?Patient presents with  ? Transitions Of Care  ? Anxiety  ? ? ?HPI ?Hannah Stephenson is here for a transition of care visit. ? ?Prior provider was: Dr. Marjory Lies, also Wilfred Lacy, NP ?Pt is without acute concerns.  ? ?Bil ear fullness ?Hoarseness ?No chest congestion ?Cough, with clear phlegn ?Pnd  ?These sx have ben for the last 2-3 weeks ? ?Also concerned bc of urinary frequency. Waking up 2-3 times a night to go pee.  ? ?chronic concerns: ? ?Anxiety/depression: sertraline 50 mg once daily  ?A lot of stress at work and at home. Daughter at home giving her stress and getting a lot of kick back from her. A lot of arguing. Occupational hygienist at work , work becoming a bit more busy. Was on xanax 0.25 mg in the past, but weaned herself off.  ? ?HTN: losartan 25 mg (taking 1 po twice daily), increased anxiety. Average at home with blood pressure is around 130-140/90 (this has been without her blood pressure medication). When she was taking her losartan her average 120/80 or so.  ? ?Chronic cough: has been since 1/22 since she had covid. Just started on losartan about three months ago. No change in cough since starting losartan.   ? ? ?Past Medical History:  ?Diagnosis Date  ? Anxiety   ? Depression   ? History of infection due to human papilloma virus (HPV)   ? History of pancreatitis   ? Migraines   ? OCD (obsessive compulsive disorder)   ? Scoliosis   ? mild  ? Wrist fracture, left   ? ? ?Past Surgical History:  ?Procedure Laterality Date  ? CESAREAN SECTION    ? CYSTOSCOPY  06/01/2016  ? Procedure: CYSTOSCOPY;  Surgeon: Gae Dry, MD;  Location: [facility] ORS;  Service: Gynecology;;  ? DILATION AND CURETTAGE OF UTERUS    ? lap hysterectomy partial    ? still with bil ovaries  ? LAPAROSCOPIC HYSTERECTOMY Bilateral 06/01/2016  ? Procedure: HYSTERECTOMY TOTAL  LAPAROSCOPIC/ BILATERAL SALPINGECTOMY;  Surgeon: Gae Dry, MD;  Location: [facility] OR[facility];  Service: Gynecology;  Laterality: Bilateral;  ? PILONIDAL CYST EXCISION    ? UPPER GI ENDOSCOPY    ? WISDOM TOOTH EXTRACTION    ? ? ?Family History  ?Problem Relation Age of Onset  ? Depression Mother   ? Thyroid disease Mother   ? Diabetes type II Father   ? Cancer Sister   ?     unknown  ? Depression Maternal Grandmother   ? Parkinson's disease Maternal Grandmother   ? Alzheimer's disease Maternal Grandmother   ? ? ?Social History  ? ?Socioeconomic History  ? Marital status: Divorced  ?  Spouse name: Not on file  ? Number of children: 1  ? Years of education: Not on file  ? Highest education level: Not on file  ?Occupational History  ? Occupation: [employer]  ?  Employer: [employer]  ?  Comment: Customer Service  ?Tobacco Use  ? Smoking status: Never  ? Smokeless tobacco: Never  ?Vaping Use  ? Vaping Use: Never used  ?Substance and Sexual Activity  ? Alcohol use: No  ? Drug use: No  ? Sexual activity: Yes  ?  Birth control/protection: Post-menopausal, Surgical  ?Other Topics Concern  ? Not on file  ?Social History  Narrative  ? Divorced  ? One girl  ? ?Social Determinants of Health  ? ?Financial Resource Strain: Not on file  ?Food Insecurity: Not on file  ?Transportation Needs: Not on file  ?Physical Activity: Not on file  ?Stress: Not on file  ?Social Connections: Not on file  ?Intimate Partner Violence: Not on file  ? ? ?Outpatient Medications Prior to Visit  ?Medication Sig Dispense Refill  ? fluticasone (FLONASE) 50 MCG/ACT nasal spray Place 2 sprays into both nostrils daily. 16 g 6  ? loratadine (CLARITIN) 10 MG tablet Take 10 mg by mouth daily.    ? ALPRAZolam (XANAX) 0.25 MG tablet Take 1 tablet (0.25 mg total) by mouth 3 (three) times daily as needed. 30 tablet 0  ? benzonatate (TESSALON) 100 MG capsule Take 1 capsule (100 mg total) by mouth 2 (two) times daily as needed for cough. 20 capsule 0  ? losartan (COZAAR) 25  MG tablet Take 1 tablet (25 mg total) by mouth 2 (two) times daily. 180 tablet 3  ? sertraline (ZOLOFT) 50 MG tablet Take 1 tablet (50 mg total) by mouth daily. 90 tablet 3  ? ?No facility-administered medications prior to visit.  ? ? ?Allergies  ?Allergen Reactions  ? Penicillins Itching  ?  REACTION: feels hot, nauseated, and headache.   ? Contrast Media [Iodinated Contrast Media]   ?  Per patient her mother told her she had a contrast allergy causing pancreatitis. I suspect her pancreatitis was due to cholelithiasis. She remembers it was for a test where they injected contrast to evaluate her GB and biliary tree.   ? Doxycycline Nausea And Vomiting  ? ? ?ROS ?Review of Systems  ?Constitutional:  Positive for fatigue. Negative for chills and fever.  ?HENT:  Positive for congestion, postnasal drip, rhinorrhea, sinus pressure, sinus pain and sore throat. Negative for sneezing and voice change.   ?Respiratory:  Positive for cough. Negative for chest tightness and shortness of breath.   ?Cardiovascular:  Negative for chest pain, palpitations and leg swelling.  ?Gastrointestinal:  Negative for diarrhea and nausea.  ?Genitourinary:  Positive for frequency. Negative for difficulty urinating, menstrual problem and vaginal bleeding.  ?Neurological:  Negative for dizziness and headaches.  ?Psychiatric/Behavioral:  Positive for sleep disturbance (hard to sleep). Negative for agitation, self-injury and suicidal ideas. The patient is nervous/anxious.   ?All other systems reviewed and are negative. ? ? ? ?  ?Objective:  ?  ?Physical Exam ?Vitals reviewed.  ?Constitutional:   ?   General: She is not in acute distress. ?   Appearance: Normal appearance. She is not ill-appearing or toxic-appearing.  ?HENT:  ?   Right Ear: Tympanic membrane normal. There is impacted cerumen.  ?   Left Ear: Tympanic membrane normal.  ?   Nose:  ?   Right Turbinates: Swollen.  ?   Left Turbinates: Swollen.  ?   Right Sinus: Maxillary sinus tenderness  present.  ?   Left Sinus: Maxillary sinus tenderness present.  ?   Mouth/Throat:  ?   Mouth: Mucous membranes are moist.  ?   Pharynx: Posterior oropharyngeal erythema present. No pharyngeal swelling.  ?   Tonsils: No tonsillar exudate.  ?Eyes:  ?   Extraocular Movements: Extraocular movements intact.  ?   Conjunctiva/sclera: Conjunctivae normal.  ?   Pupils: Pupils are equal, round, and reactive to light.  ?Neck:  ?   Thyroid: No thyroid mass.  ?Cardiovascular:  ?   Rate and Rhythm: Normal rate and  regular rhythm.  ?Pulmonary:  ?   Effort: Pulmonary effort is normal.  ?   Breath sounds: Normal breath sounds.  ?Abdominal:  ?   General: Abdomen is flat. Bowel sounds are normal.  ?   Palpations: Abdomen is soft.  ?Musculoskeletal:     ?   General: Normal range of motion.  ?Lymphadenopathy:  ?   Cervical:  ?   Right cervical: No superficial cervical adenopathy. ?   Left cervical: No superficial cervical adenopathy.  ?Skin: ?   General: Skin is warm.  ?   Capillary Refill: Capillary refill takes less than 2 seconds.  ?Neurological:  ?   General: No focal deficit present.  ?   Mental Status: She is alert and oriented to person, place, and time.  ?Psychiatric:     ?   Mood and Affect: Mood normal.     ?   Behavior: Behavior normal.     ?   Thought Content: Thought content normal.     ?   Judgment: Judgment normal.  ? ? ? ?BP (!) 148/102   Pulse 95   Temp 98.2 ?F (36.8 ?C)   Resp 16   Ht '5\' 2"'$  (1.575 m)   Wt 240 lb 5 oz (109 kg)   LMP 05/18/2016   SpO2 96%   BMI 43.95 kg/m?  ?Wt Readings from Last 3 Encounters:  ?04/22/22 240 lb 5 oz (109 kg)  ?11/25/21 237 lb (107.5 kg)  ?11/10/21 240 lb (108.9 kg)  ? ? ? ?Health Maintenance Due  ?Topic Date Due  ? COVID-19 Vaccine (1) Never done  ? HIV Screening  Never done  ? Hepatitis C Screening  Never done  ? PAP SMEAR-Modifier  04/04/2019  ? COLONOSCOPY (Pts 45-48yr Insurance coverage will need to be confirmed)  Never done  ? ? ?There are no preventive care reminders to  display for this patient. ? ?Lab Results  ?Component Value Date  ? TSH 2.69 11/10/2021  ? ?Lab Results  ?Component Value Date  ? WBC 9.6 11/10/2021  ? HGB 13.0 11/10/2021  ? HCT 39.8 11/10/2021  ? MCV 91.9 11/11/19

## 2022-04-22 NOTE — Patient Instructions (Addendum)
Recommend either zyrtec or xyzal but take as night.  ?Continue flonase.  ? ?Continue losartan 50 mg once daily ? ?Increase sertraline /zoloft to 100 mg once daily.  ? ?Welcome to our clinic, I am happy to have you as my new patient. I am excited to continue on this healthcare journey with you. ? ?Stop by the lab prior to leaving today. I will notify you of your results once received.  ? ?Please keep in mind ?Any my chart messages you send have p to a three business day turnaround for a response.  ?Phone calls may have up to a one day business turnaround for a  response.  ? ?If you need a medication refill I recommend you request it through the pharmacy as this is easiest for [place] rather than sending a message and or phone call.  ? ?Due to recent changes in healthcare laws, you may see results of your imaging and/or laboratory studies on MyChart before I have had a chance to review them.  I understand that in some cases there may be results that are confusing or concerning to you. Please understand that not all results are received at the same time and often I may need to interpret multiple results in order to provide you with the best plan of care or course of treatment. Therefore, I ask that you please give me 2 business days to thoroughly review all your results before contacting my office for clarification. Should we see a critical lab result, you will be contacted sooner.  ? ?It was a pleasure seeing you today! Please do not hesitate to reach out with any questions and or concerns. ? ?Regards,  ? ?Teneisha Gignac ?FNP-C ? ?

## 2022-04-22 NOTE — Assessment & Plan Note (Signed)
Refill losartan 50 mg once daily  ?Pt advised of the following:  ?Continue medication as prescribed. Monitor blood pressure periodically and/or when you feel symptomatic. Goal is <130/90 on average. Ensure that you have rested for 30 minutes prior to checking your blood pressure. Record your readings and bring them to your next visit if necessary.work on a low sodium diet. ? ?

## 2022-04-22 NOTE — Assessment & Plan Note (Signed)
Workup to include cbc 12 folate tsh bmp ?

## 2022-04-22 NOTE — Assessment & Plan Note (Signed)
Ordered lipid panel, pending results. Work on low cholesterol diet and exercise as tolerated ? ?

## 2022-04-22 NOTE — Assessment & Plan Note (Signed)
Ordered a1c and urine microalbumin, pending results ?Work on diabetic diet ?

## 2022-04-22 NOTE — Assessment & Plan Note (Signed)
Order cbc pending results. 

## 2022-04-22 NOTE — Assessment & Plan Note (Signed)
Increase sertraline to 100 mg po qd ?Work on anxiety reducing techniques ?

## 2022-04-22 NOTE — Assessment & Plan Note (Signed)
Urine culture ordered pending results 

## 2022-04-23 ENCOUNTER — Encounter: Payer: Self-pay | Admitting: Family

## 2022-04-23 LAB — CBC WITH DIFFERENTIAL/PLATELET
Basophils Absolute: 0.1 10*3/uL (ref 0.0–0.1)
Basophils Relative: 0.9 % (ref 0.0–3.0)
Eosinophils Absolute: 0.2 10*3/uL (ref 0.0–0.7)
Eosinophils Relative: 1.4 % (ref 0.0–5.0)
HCT: 37.5 % (ref 36.0–46.0)
Hemoglobin: 12.2 g/dL (ref 12.0–15.0)
Lymphocytes Relative: 22.5 % (ref 12.0–46.0)
Lymphs Abs: 2.8 10*3/uL (ref 0.7–4.0)
MCHC: 32.6 g/dL (ref 30.0–36.0)
MCV: 93.4 fl (ref 78.0–100.0)
Monocytes Absolute: 0.8 10*3/uL (ref 0.1–1.0)
Monocytes Relative: 6.1 % (ref 3.0–12.0)
Neutro Abs: 8.6 10*3/uL — ABNORMAL HIGH (ref 1.4–7.7)
Neutrophils Relative %: 69.1 % (ref 43.0–77.0)
Platelets: 447 10*3/uL — ABNORMAL HIGH (ref 150.0–400.0)
RBC: 4.01 Mil/uL (ref 3.87–5.11)
RDW: 13.5 % (ref 11.5–15.5)
WBC: 12.5 10*3/uL — ABNORMAL HIGH (ref 4.0–10.5)

## 2022-04-23 LAB — MICROALBUMIN / CREATININE URINE RATIO
Creatinine,U: 240.8 mg/dL
Microalb Creat Ratio: 2.6 mg/g (ref 0.0–30.0)
Microalb, Ur: 6.2 mg/dL — ABNORMAL HIGH (ref 0.0–1.9)

## 2022-04-23 LAB — LIPID PANEL
Cholesterol: 255 mg/dL — ABNORMAL HIGH (ref 0–200)
HDL: 46.7 mg/dL (ref >39.00)
LDL Cholesterol: 181 mg/dL — ABNORMAL HIGH (ref 0–99)
NonHDL: 207.91
Total CHOL/HDL Ratio: 5
Triglycerides: 133 mg/dL (ref 0.0–149.0)
VLDL: 26.6 mg/dL (ref 0.0–40.0)

## 2022-04-23 LAB — URINE CULTURE
MICRO NUMBER:: 13383278
SPECIMEN QUALITY:: ADEQUATE

## 2022-04-23 LAB — B12 AND FOLATE PANEL
Folate: 11.4 ng/mL (ref >5.9)
Vitamin B-12: 492 pg/mL (ref 211–911)

## 2022-04-23 LAB — HEMOGLOBIN A1C: Hgb A1c MFr Bld: 6.1 % (ref 4.6–6.5)

## 2022-04-23 LAB — TSH: TSH: 1.69 u[IU]/mL (ref 0.35–5.50)

## 2022-04-23 NOTE — Telephone Encounter (Signed)
Agree with precautions given to pt  Agree with nurse assessment in plan.  Thank you for speaking with them. 

## 2022-04-23 NOTE — Telephone Encounter (Signed)
Spoke to patient by telephone and was advised that she is now at home and resting. Patient stated that she is feeling better and her blood pressure has come down to 122/88 heart rate 90. Patient stated that she has been out of her medication for about two weeks and just started it back last night.Patient denies chest pain, SOB, or difficulty breathing.Patient was given ER precautions and she verbalized understanding. Patient stated that she is on mychart if Tabatha needs to send her a note. ?

## 2022-04-26 ENCOUNTER — Other Ambulatory Visit: Payer: Self-pay | Admitting: Family

## 2022-04-26 DIAGNOSIS — D72829 Elevated white blood cell count, unspecified: Secondary | ICD-10-CM

## 2022-04-26 NOTE — Telephone Encounter (Signed)
Called pt and she is going to call the pharmacy to see if they have the antibiotic. ?

## 2022-04-26 NOTE — Addendum Note (Signed)
Addended by: Tammi Sou on: 04/26/2022 11:56 AM ? ? Modules accepted: Orders ? ?

## 2022-05-19 ENCOUNTER — Other Ambulatory Visit (INDEPENDENT_AMBULATORY_CARE_PROVIDER_SITE_OTHER): Payer: Managed Care, Other (non HMO)

## 2022-05-19 DIAGNOSIS — D72829 Elevated white blood cell count, unspecified: Secondary | ICD-10-CM

## 2022-05-20 LAB — CBC WITH DIFFERENTIAL/PLATELET
Basophils Absolute: 0.1 10*3/uL (ref 0.0–0.1)
Basophils Relative: 1 % (ref 0.0–3.0)
Eosinophils Absolute: 0.2 10*3/uL (ref 0.0–0.7)
Eosinophils Relative: 1.9 % (ref 0.0–5.0)
HCT: 39.7 % (ref 36.0–46.0)
Hemoglobin: 12.7 g/dL (ref 12.0–15.0)
Lymphocytes Relative: 25.9 % (ref 12.0–46.0)
Lymphs Abs: 2.6 10*3/uL (ref 0.7–4.0)
MCHC: 32 g/dL (ref 30.0–36.0)
MCV: 93.1 fl (ref 78.0–100.0)
Monocytes Absolute: 0.4 10*3/uL (ref 0.1–1.0)
Monocytes Relative: 3.7 % (ref 3.0–12.0)
Neutro Abs: 6.7 10*3/uL (ref 1.4–7.7)
Neutrophils Relative %: 67.5 % (ref 43.0–77.0)
Platelets: 423 10*3/uL — ABNORMAL HIGH (ref 150.0–400.0)
RBC: 4.26 Mil/uL (ref 3.87–5.11)
RDW: 13.9 % (ref 11.5–15.5)
WBC: 9.9 10*3/uL (ref 4.0–10.5)

## 2022-05-21 NOTE — Progress Notes (Signed)
Has patient ever had low platelets in the past? Has she seen a hematologist for this?

## 2022-05-24 ENCOUNTER — Ambulatory Visit (INDEPENDENT_AMBULATORY_CARE_PROVIDER_SITE_OTHER): Payer: Managed Care, Other (non HMO) | Admitting: Family

## 2022-05-24 ENCOUNTER — Encounter: Payer: Self-pay | Admitting: Family

## 2022-05-24 VITALS — BP 134/98 | HR 84 | Temp 98.5°F | Resp 16 | Ht 62.0 in | Wt 239.2 lb

## 2022-05-24 DIAGNOSIS — I10 Essential (primary) hypertension: Secondary | ICD-10-CM

## 2022-05-24 DIAGNOSIS — R7303 Prediabetes: Secondary | ICD-10-CM | POA: Diagnosis not present

## 2022-05-24 DIAGNOSIS — F419 Anxiety disorder, unspecified: Secondary | ICD-10-CM

## 2022-05-24 DIAGNOSIS — R7989 Other specified abnormal findings of blood chemistry: Secondary | ICD-10-CM

## 2022-05-24 DIAGNOSIS — D75839 Thrombocytosis, unspecified: Secondary | ICD-10-CM

## 2022-05-24 DIAGNOSIS — F32A Depression, unspecified: Secondary | ICD-10-CM

## 2022-05-24 DIAGNOSIS — E782 Mixed hyperlipidemia: Secondary | ICD-10-CM

## 2022-05-24 MED ORDER — ALPRAZOLAM 0.25 MG PO TABS: 0.2500 mg | ORAL_TABLET | Freq: Two times a day (BID) | ORAL | 0 refills | Status: DC | PRN

## 2022-05-24 NOTE — Patient Instructions (Addendum)
A referral was placed today for hematology as well as to a nutritionist.  Please let us know if you have not heard back within 2 weeks about the referral.  Start monitoring your blood pressure daily, around the same time of day, for the next 2-3 weeks.  Ensure that you have rested for 30 minutes prior to checking your blood pressure. Record your readings and bring them to your next visit.  I have created an order for lab work today during our visit.  Please schedule an appointment on your way out to return to the lab at your convenience. Please return fasting at your lab appointment (meaning you can only drink black coffee and or water prior to your appointment). I will reach out to you in regards to the labs when I receive the results.   Due to recent changes in healthcare laws, you may see results of your imaging and/or laboratory studies on MyChart before I have had a chance to review them.  I understand that in some cases there may be results that are confusing or concerning to you. Please understand that not all results are received at the same time and often I may need to interpret multiple results in order to provide you with the best plan of care or course of treatment. Therefore, I ask that you please give me 2 business days to thoroughly review all your results before contacting my office for clarification. Should we see a critical lab result, you will be contacted sooner.   It was a pleasure seeing you today! Please do not hesitate to reach out with any questions and or concerns.  Regards,   Eugenia Pancoast FNP-C

## 2022-05-24 NOTE — Assessment & Plan Note (Signed)
Work on diabetic diet Referral to nutritionist

## 2022-05-24 NOTE — Progress Notes (Signed)
Established Patient Office Visit  Subjective:  Patient ID: Hannah Stephenson, female    DOB: 11/02/1976  Age: 46 y.o. MRN: [id number]  CC:  Chief Complaint  Patient presents with   Hypertension    HPI Hannah Stephenson is here today for follow up.   Pt is with acute concerns.  HTN: average blood pressure at home has been 110/80, highest was 119/80 lowest 64. No cp palp and or sob. Currently on losartan 50 mg once daily. She denies dizziness or fatigue. Really working on watching sodium in diet. Has new puppy walking daily.   Also with elevated microalbumin, pt on losartan.   Hyperlipidemia: had had water and a soda.  Lab Results  Component Value Date   CHOL 255 (H) 04/22/2022   HDL 46.70 04/22/2022   LDLCALC 181 (H) 04/22/2022   TRIG 133.0 04/22/2022   CHOLHDL 5 04/22/2022   Anxiety: has bene taking xanax prn , not overly often, and mainly situational. Helps her with social situations.   Elevated platelets: is not a smoker. No rashes. Does not bruise easily. Has had mild elevation platelets chronic now for the last six months.  Lab Results  Component Value Date   WBC 9.9 05/19/2022   HGB 12.7 05/19/2022   HCT 39.7 05/19/2022   MCV 93.1 05/19/2022   PLT 423.0 (H) 05/19/2022   Prediabetes: working on exercise walking dog daily for about 20-30 minutes. Cutting down on fast food, cooking most all meals at home. Used to drink sun kissed zero sugar about 5 or more a day but has cut down once a day. Is working on 112 oz of water daily now doing well.  Lab Results  Component Value Date   HGBA1C 6.1 04/22/2022   Vitamin b12 def: now on a new vitamin with 1000 mcg of b12 now taking daily.   Wt Readings from Last 3 Encounters:  05/24/22 239 lb 4 oz (108.5 kg)  04/22/22 240 lb 5 oz (109 kg)  11/25/21 237 lb (107.5 kg)   Has lost one pound since last visit.   Past Medical History:  Diagnosis Date   Anxiety    Depression    History of infection due to human papilloma virus  (HPV)    History of pancreatitis    Migraines    OCD (obsessive compulsive disorder)    Scoliosis    mild   Wrist fracture, left     Past Surgical History:  Procedure Laterality Date   CESAREAN SECTION     CYSTOSCOPY  06/01/2016   Procedure: CYSTOSCOPY;  Surgeon: Gae Dry, MD;  Location: [facility];  Service: Gynecology;;   DILATION AND CURETTAGE OF UTERUS     lap hysterectomy partial     still with bil ovaries   LAPAROSCOPIC HYSTERECTOMY Bilateral 06/01/2016   Procedure: HYSTERECTOMY TOTAL LAPAROSCOPIC/ BILATERAL SALPINGECTOMY;  Surgeon: Gae Dry, MD;  Location: [facility];  Service: Gynecology;  Laterality: Bilateral;   PILONIDAL CYST EXCISION     UPPER GI ENDOSCOPY     WISDOM TOOTH EXTRACTION      Family History  Problem Relation Age of Onset   Depression Mother    Thyroid disease Mother    Diabetes type II Father    Cancer Sister        unknown   Depression Maternal Grandmother    Parkinson's disease Maternal Grandmother    Alzheimer's disease Maternal Grandmother     Social History   Socioeconomic History   Marital  status: Divorced    Spouse name: Not on file   Number of children: 1   Years of education: Not on file   Highest education level: Not on file  Occupational History   Occupation: Retail banker: [employer]    Comment: Customer Service  Tobacco Use   Smoking status: Never   Smokeless tobacco: Never  Vaping Use   Vaping Use: Never used  Substance and Sexual Activity   Alcohol use: No   Drug use: No   Sexual activity: Yes    Birth control/protection: Post-menopausal, Surgical  Other Topics Concern   Not on file  Social History Narrative   Divorced   One girl   Social Determinants of Health   Financial Resource Strain: Not on file  Food Insecurity: Not on file  Transportation Needs: Not on file  Physical Activity: Not on file  Stress: Not on file  Social Connections: Not on file  Intimate Partner Violence: Not on file     Outpatient Medications Prior to Visit  Medication Sig Dispense Refill   fluticasone (FLONASE) 50 MCG/ACT nasal spray Place 2 sprays into both nostrils daily. 16 g 6   loratadine (CLARITIN) 10 MG tablet Take 10 mg by mouth daily.     losartan (COZAAR) 50 MG tablet Take 1 tablet (50 mg total) by mouth daily. 90 tablet 1   sertraline (ZOLOFT) 100 MG tablet Take 1 tablet (100 mg total) by mouth daily. 90 tablet 0   No facility-administered medications prior to visit.    Allergies  Allergen Reactions   Penicillins Itching    REACTION: feels hot, nauseated, and headache.    Contrast Media [Iodinated Contrast [place]     Per patient her mother told her she had a contrast allergy causing pancreatitis. I suspect her pancreatitis was due to cholelithiasis. She remembers it was for a test where they injected contrast to evaluate her GB and biliary tree.    Doxycycline Nausea And Vomiting         Objective:    Physical Exam  Gen: NAD, resting comfortably CV: RRR with no murmurs appreciated Pulm: NWOB, CTAB with no crackles, wheezes, or rhonchi Skin: warm, dry Psych: Normal affect and thought content  BP (!) 134/98   Pulse 84   Temp 98.5 F (36.9 C)   Resp 16   Ht '5\' 2"'$  (1.575 m)   Wt 239 lb 4 oz (108.5 kg)   LMP 05/18/2016   SpO2 97%   BMI 43.76 kg/m  Wt Readings from Last 3 Encounters:  05/24/22 239 lb 4 oz (108.5 kg)  04/22/22 240 lb 5 oz (109 kg)  11/25/21 237 lb (107.5 kg)     Health Maintenance Due  Topic Date Due   COVID-19 Vaccine (1) Never done   HIV Screening  Never done   Hepatitis C Screening  Never done   PAP SMEAR-Modifier  04/04/2019   COLONOSCOPY (Pts 45-74yr Insurance coverage will need to be confirmed)  Never done    There are no preventive care reminders to display for this patient.  Lab Results  Component Value Date   TSH 1.69 04/22/2022   Lab Results  Component Value Date   WBC 9.9 05/19/2022   HGB 12.7 05/19/2022   HCT 39.7  05/19/2022   MCV 93.1 05/19/2022   PLT 423.0 (H) 05/19/2022   Lab Results  Component Value Date   NA 138 11/10/2021   K 3.6 11/10/2021   CO2 26 11/10/2021  GLUCOSE 95 11/10/2021   BUN 12 11/10/2021   CREATININE 0.78 11/10/2021   BILITOT <0.2 07/07/2017   ALKPHOS 96 07/07/2017   AST 17 07/07/2017   ALT 17 07/07/2017   PROT 7.5 07/07/2017   ALBUMIN 4.2 07/07/2017   CALCIUM 9.4 11/10/2021   GFR 90.35 11/10/2021   Lab Results  Component Value Date   CHOL 255 (H) 04/22/2022   Lab Results  Component Value Date   HDL 46.70 04/22/2022   Lab Results  Component Value Date   LDLCALC 181 (H) 04/22/2022   Lab Results  Component Value Date   TRIG 133.0 04/22/2022   Lab Results  Component Value Date   CHOLHDL 5 04/22/2022   Lab Results  Component Value Date   HGBA1C 6.1 04/22/2022      Assessment & Plan:   Problem List Items Addressed This Visit       Cardiovascular and Mediastinum   HTN (hypertension)    Reviewed blood pressure , good average Advised pt to watch for symptoms of hypotension as we can consider decrease losartan if needed Low sodium diet.       Relevant Orders   Amb ref to Medical Nutrition Therapy-MNT     Other   Anxiety and depression    Continue sertraline 100 mg once daily. Xanax 0.25 mg prn  Work on anxiety reducing techniques      Relevant Medications   ALPRAZolam (XANAX) 0.25 MG tablet   Prediabetes    Work on diabetic diet Referral to nutritionist       Relevant Orders   Amb ref to Medical Nutrition Therapy-MNT   Elevated platelet count    Chronic, referral to hematologist to r/o any other issues. No obvious inflammatory concerns. Pt does not smoke.       Mixed hyperlipidemia    Continue working on low chol diet Repeat lipid panel in two months       Relevant Orders   Amb ref to Medical Nutrition Therapy-MNT   Other Visit Diagnoses     Thrombocytosis    -  Primary   Relevant Orders   Ambulatory referral to  Hematology / Oncology       Meds ordered this encounter  Medications   ALPRAZolam (XANAX) 0.25 MG tablet    Sig: Take 1 tablet (0.25 mg total) by mouth 2 (two) times daily as needed for anxiety.    Dispense:  20 tablet    Refill:  0    Order Specific Question:   Supervising Provider    Answer:   Diona Browner, AMY E [[id number]]    Follow-up: Return in about 6 months (around 11/23/2022) for regular follow up appointment .    Eugenia Pancoast, FNP

## 2022-05-24 NOTE — Assessment & Plan Note (Signed)
Chronic, referral to hematologist to r/o any other issues. No obvious inflammatory concerns. Pt does not smoke.

## 2022-05-24 NOTE — Progress Notes (Signed)
"  High platelets" d/w pt during visit. Referred to hematology.

## 2022-05-24 NOTE — Assessment & Plan Note (Signed)
Continue working on low chol diet Repeat lipid panel in two months

## 2022-05-24 NOTE — Assessment & Plan Note (Signed)
Continue sertraline 100 mg once daily. Xanax 0.25 mg prn  Work on anxiety reducing techniques

## 2022-05-24 NOTE — Assessment & Plan Note (Signed)
Reviewed blood pressure , good average Advised pt to watch for symptoms of hypotension as we can consider decrease losartan if needed Low sodium diet.

## 2022-06-02 ENCOUNTER — Encounter: Payer: Self-pay | Admitting: Oncology

## 2022-06-02 ENCOUNTER — Inpatient Hospital Stay: Payer: Managed Care, Other (non HMO)

## 2022-06-02 ENCOUNTER — Other Ambulatory Visit: Payer: Self-pay

## 2022-06-02 ENCOUNTER — Inpatient Hospital Stay: Payer: Managed Care, Other (non HMO) | Attending: Oncology | Admitting: Oncology

## 2022-06-02 VITALS — BP 123/86 | HR 83 | Temp 96.8°F | Ht 62.0 in | Wt 239.0 lb

## 2022-06-02 DIAGNOSIS — D75839 Thrombocytosis, unspecified: Secondary | ICD-10-CM | POA: Diagnosis present

## 2022-06-02 LAB — COMPREHENSIVE METABOLIC PANEL
ALT: 23 U/L (ref 0–44)
AST: 24 U/L (ref 15–41)
Albumin: 3.9 g/dL (ref 3.5–5.0)
Alkaline Phosphatase: 93 U/L (ref 38–126)
Anion gap: 9 (ref 5–15)
BUN: 12 mg/dL (ref 6–20)
CO2: 25 mmol/L (ref 22–32)
Calcium: 8.6 mg/dL — ABNORMAL LOW (ref 8.9–10.3)
Chloride: 101 mmol/L (ref 98–111)
Creatinine, Ser: 0.81 mg/dL (ref 0.44–1.00)
GFR, Estimated: >60 mL/min (ref >60)
Glucose, Bld: 92 mg/dL (ref 70–99)
Potassium: 3.4 mmol/L — ABNORMAL LOW (ref 3.5–5.1)
Sodium: 135 mmol/L (ref 135–145)
Total Bilirubin: 0.6 mg/dL (ref 0.3–1.2)
Total Protein: 8.3 g/dL — ABNORMAL HIGH (ref 6.5–8.1)

## 2022-06-02 LAB — CBC WITH DIFFERENTIAL/PLATELET
Abs Immature Granulocytes: 0.05 10*3/uL (ref 0.00–0.07)
Basophils Absolute: 0.1 10*3/uL (ref 0.0–0.1)
Basophils Relative: 0 %
Eosinophils Absolute: 0.2 10*3/uL (ref 0.0–0.5)
Eosinophils Relative: 2 %
HCT: 38.7 % (ref 36.0–46.0)
Hemoglobin: 12.5 g/dL (ref 12.0–15.0)
Immature Granulocytes: 0 %
Lymphocytes Relative: 27 %
Lymphs Abs: 3.3 10*3/uL (ref 0.7–4.0)
MCH: 30.1 pg (ref 26.0–34.0)
MCHC: 32.3 g/dL (ref 30.0–36.0)
MCV: 93.3 fL (ref 80.0–100.0)
Monocytes Absolute: 0.6 10*3/uL (ref 0.1–1.0)
Monocytes Relative: 5 %
Neutro Abs: 8.2 10*3/uL — ABNORMAL HIGH (ref 1.7–7.7)
Neutrophils Relative %: 66 %
Platelets: 379 10*3/uL (ref 150–400)
RBC: 4.15 MIL/uL (ref 3.87–5.11)
RDW: 13.2 % (ref 11.5–15.5)
WBC: 12.4 10*3/uL — ABNORMAL HIGH (ref 4.0–10.5)
nRBC: 0 % (ref 0.0–0.2)

## 2022-06-02 LAB — TECHNOLOGIST SMEAR REVIEW: Plt Morphology: ADEQUATE

## 2022-06-02 LAB — IRON AND TIBC
Iron: 46 ug/dL (ref 28–170)
Iron: 52 ug/dL (ref 28–170)
Saturation Ratios: 13 % (ref 10.4–31.8)
Saturation Ratios: UNDETERMINED % (ref 10.4–31.8)
TIBC: 350 ug/dL (ref 250–450)
TIBC: 358 ug/dL (ref 250–450)
UIBC: 304 ug/dL
UIBC: UNDETERMINED ug/dL

## 2022-06-02 LAB — HEPATITIS PANEL, ACUTE
HCV Ab: NONREACTIVE
Hep A IgM: NONREACTIVE
Hep B C IgM: NONREACTIVE
Hepatitis B Surface Ag: NONREACTIVE

## 2022-06-02 LAB — FERRITIN: Ferritin: 52 ng/mL (ref 11–307)

## 2022-06-02 LAB — LACTATE DEHYDROGENASE: LDH: 116 U/L (ref 98–192)

## 2022-06-03 LAB — HIV ANTIBODY (ROUTINE TESTING W REFLEX): HIV Screen 4th Generation wRfx: NONREACTIVE

## 2022-06-04 NOTE — Progress Notes (Signed)
Hematology/Oncology Consult note [phone] [phone]      Patient Care Team: Mort Sawyers, FNP as PCP - General (Family Medicine)  REFERRING PROVIDER: Mort Sawyers, FNP  CHIEF COMPLAINTS/REASON FOR VISIT:  Anemia thrombocytosis  HISTORY OF PRESENTING ILLNESS:  Hannah Stephenson is a 46 y.o. female who was seen in consultation at the request of Mort Sawyers, FNP for evaluation of thromcbocytosis Reviewed patient's labs.  05/19/2022, CBC showed a platelet count of 423. Normal white count and hemoglobin count.  Reviewed patient's previous labs. Thrombocytosis onset is chronic onset , duration since 6 months ago. No aggravating or elevated factors. Associated symptoms or signs:  Denies weight loss, fever, chills, fatigue, night sweats.  Context: Patient denies any smoking history, no history of thrombosis.  No history of iron deficiency anemia.  Patient has a history of hysterectomy.   Review of Systems  Constitutional:  Negative for appetite change, chills, fatigue and fever.  HENT:   Negative for hearing loss and voice change.   Eyes:  Negative for eye problems.  Respiratory:  Negative for chest tightness and cough.   Cardiovascular:  Negative for chest pain.  Gastrointestinal:  Negative for abdominal distention, abdominal pain and blood in stool.  Endocrine: Negative for hot flashes.  Genitourinary:  Negative for difficulty urinating and frequency.   Musculoskeletal:  Negative for arthralgias.  Skin:  Negative for itching and rash.  Neurological:  Negative for extremity weakness.  Hematological:  Negative for adenopathy.  Psychiatric/Behavioral:  Negative for confusion.     MEDICAL HISTORY:  Past Medical History:  Diagnosis Date   Anxiety    Depression    History of infection due to human papilloma virus (HPV)    History of pancreatitis    Migraines    OCD (obsessive compulsive disorder)    Scoliosis    mild   Wrist fracture, left      SURGICAL HISTORY: Past Surgical History:  Procedure Laterality Date   CESAREAN SECTION     CYSTOSCOPY  06/01/2016   Procedure: CYSTOSCOPY;  Surgeon: Nadara Mustard, MD;  Location: [facility];  Service: Gynecology;;   DILATION AND CURETTAGE OF UTERUS     lap hysterectomy partial     still with bil ovaries   LAPAROSCOPIC HYSTERECTOMY Bilateral 06/01/2016   Procedure: HYSTERECTOMY TOTAL LAPAROSCOPIC/ BILATERAL SALPINGECTOMY;  Surgeon: Nadara Mustard, MD;  Location: [facility];  Service: Gynecology;  Laterality: Bilateral;   PILONIDAL CYST EXCISION     UPPER GI ENDOSCOPY     WISDOM TOOTH EXTRACTION      SOCIAL HISTORY: Social History   Socioeconomic History   Marital status: Divorced    Spouse name: Not on file   Number of children: 1   Years of education: Not on file   Highest education level: Not on file  Occupational History   Occupation: Nurse, 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  Tobacco Use   Smoking status: Never   Smokeless tobacco: Never  Vaping Use   Vaping Use: Never used  Substance and Sexual Activity   Alcohol use: No   Drug use: No   Sexual activity: Yes    Birth control/protection: Post-menopausal, Surgical  Other Topics Concern   Not on file  Social History Narrative   Divorced   One girl   Social Determinants of Health   Financial Resource Strain: Not on file  Food Insecurity: Not on file  Transportation Needs: Not on file  Physical Activity: Not  on file  Stress: Not on file  Social Connections: Not on file  Intimate Partner Violence: Not on file    FAMILY HISTORY: Family History  Problem Relation Age of Onset   Depression Mother    Thyroid disease Mother    Diabetes type II Father    Depression Maternal Grandmother    Parkinson's disease Maternal Grandmother    Alzheimer's disease Maternal Grandmother     ALLERGIES:  is allergic to penicillins, contrast media [iodinated contrast media], and  doxycycline.  MEDICATIONS:  Current Outpatient Medications  Medication Sig Dispense Refill   ALPRAZolam (XANAX) 0.25 MG tablet Take 1 tablet (0.25 mg total) by mouth 2 (two) times daily as needed for anxiety. 20 tablet 0   fluticasone (FLONASE) 50 MCG/ACT nasal spray Place 2 sprays into both nostrils daily. 16 g 6   loratadine (CLARITIN) 10 MG tablet Take 10 mg by mouth daily.     losartan (COZAAR) 50 MG tablet Take 1 tablet (50 mg total) by mouth daily. 90 tablet 1   sertraline (ZOLOFT) 100 MG tablet Take 1 tablet (100 mg total) by mouth daily. 90 tablet 0   No current facility-administered medications for this visit.     PHYSICAL EXAMINATION: ECOG PERFORMANCE STATUS: 0 - Asymptomatic Vitals:   06/02/22 1501  BP: 123/86  Pulse: 83  Temp: (!) 96.8 F (36 C)   Filed Weights   06/02/22 1501  Weight: 239 lb (108.4 kg)    Physical Exam Constitutional:      General: She is not in acute distress.    Appearance: She is obese.  HENT:     Head: Normocephalic and atraumatic.  Eyes:     General: No scleral icterus. Cardiovascular:     Rate and Rhythm: Normal rate and regular rhythm.     Heart sounds: Normal heart sounds.  Pulmonary:     Effort: Pulmonary effort is normal. No respiratory distress.     Breath sounds: No wheezing.  Abdominal:     General: Bowel sounds are normal. There is no distension.     Palpations: Abdomen is soft.  Musculoskeletal:        General: No deformity. Normal range of motion.     Cervical back: Normal range of motion and neck supple.  Skin:    General: Skin is warm and dry.     Findings: No erythema or rash.  Neurological:     Mental Status: She is alert and oriented to person, place, and time. Mental status is at baseline.     Cranial Nerves: No cranial nerve deficit.     Coordination: Coordination normal.  Psychiatric:        Mood and Affect: Mood normal.      LABORATORY DATA:  I have reviewed the data as listed Lab Results   Component Value Date   WBC 12.4 (H) 06/02/2022   HGB 12.5 06/02/2022   HCT 38.7 06/02/2022   MCV 93.3 06/02/2022   PLT 379 06/02/2022   Recent Labs    11/10/21 0858 06/02/22 1532  NA 138 135  K 3.6 3.4*  CL 103 101  CO2 26 25  GLUCOSE 95 92  BUN 12 12  CREATININE 0.78 0.81  CALCIUM 9.4 8.6*  GFRNONAA  --  >60  PROT  --  8.3*  ALBUMIN  --  3.9  AST  --  24  ALT  --  23  ALKPHOS  --  93  BILITOT  --  0.6   Iron/TIBC/Ferritin/ %  Sat    Component Value Date/Time   IRON 52 06/02/2022 1532   IRON 46 06/02/2022 1532   TIBC 358 06/02/2022 1532   TIBC 350 06/02/2022 1532   FERRITIN 52 06/02/2022 1532   IRONPCTSAT UNABLE TO CALCULATE 06/02/2022 1532   IRONPCTSAT 13 06/02/2022 1532      RADIOGRAPHIC STUDIES: I have personally reviewed the radiological images as listed and agreed with the findings in the report. No results found.    ASSESSMENT & PLAN:   Thrombocytosis I discussed with patient that the differential diagnosis of the thrombosis is broad, including benign etiology such as reactive to surgery, trauma, infection, nutrition deficiency, etc, as well as malignant etiology including underlying bone marrow disorders.   For the work up of patient's thrombocytosis, I recommend checking CBC;CMP, LDH, pathology smear review, iron,TIBC, ferritin and JAK 2 mutation,  BCR ABL; MPL; CALR mutation.   Orders Placed This Encounter  Procedures   Technologist smear review    Standing Status:   Future    Number of Occurrences:   1    Standing Expiration Date:   06/03/2023   Comprehensive metabolic panel    Standing Status:   Future    Number of Occurrences:   1    Standing Expiration Date:   06/03/2023   CBC with Differential/Platelet    Standing Status:   Future    Number of Occurrences:   1    Standing Expiration Date:   06/03/2023   ANA, IFA (with reflex)    Standing Status:   Future    Number of Occurrences:   1    Standing Expiration Date:   06/03/2023   HIV  Antibody (routine testing w rflx)    Standing Status:   Future    Number of Occurrences:   1    Standing Expiration Date:   06/03/2023   Hepatitis panel, acute    Standing Status:   Future    Number of Occurrences:   1    Standing Expiration Date:   06/03/2023   Lactate dehydrogenase    Standing Status:   Future    Number of Occurrences:   1    Standing Expiration Date:   06/03/2023   Ferritin    Standing Status:   Future    Number of Occurrences:   1    Standing Expiration Date:   12/02/2022   Iron and TIBC    Standing Status:   Future    Number of Occurrences:   1    Standing Expiration Date:   06/03/2023   BCR-ABL1 FISH    Standing Status:   Future    Number of Occurrences:   1    Standing Expiration Date:   06/03/2023   JAK2 V617F rfx CALR/MPL/E12-15    Standing Status:   Future    Number of Occurrences:   1    Standing Expiration Date:   06/03/2023   Patient will follow-up in 3 weeks to go over results. All questions were answered. The patient knows to call the clinic with any problems, questions or concerns.  Rickard Patience, MD, PhD [facility] Hematology Oncology 06/02/2022

## 2022-06-05 LAB — ANTINUCLEAR ANTIBODIES, IFA: ANA Ab, IFA: NEGATIVE

## 2022-06-07 LAB — BCR-ABL1 FISH
Cells Analyzed: 200
Cells Counted: 200

## 2022-06-08 LAB — CALR +MPL + E12-E15  (REFLEX)

## 2022-06-08 LAB — JAK2 V617F RFX CALR/MPL/E12-15

## 2022-06-23 ENCOUNTER — Encounter: Payer: Self-pay | Admitting: Oncology

## 2022-06-23 ENCOUNTER — Inpatient Hospital Stay: Payer: Managed Care, Other (non HMO) | Attending: Oncology | Admitting: Oncology

## 2022-06-25 ENCOUNTER — Other Ambulatory Visit: Payer: Self-pay | Admitting: Family

## 2022-06-25 DIAGNOSIS — F419 Anxiety disorder, unspecified: Secondary | ICD-10-CM

## 2022-07-12 ENCOUNTER — Other Ambulatory Visit: Payer: Self-pay | Admitting: Family

## 2022-07-12 DIAGNOSIS — F32A Depression, unspecified: Secondary | ICD-10-CM

## 2022-08-02 ENCOUNTER — Ambulatory Visit: Payer: Managed Care, Other (non HMO) | Admitting: Skilled Nursing Facility1

## 2022-08-10 ENCOUNTER — Other Ambulatory Visit: Payer: Self-pay | Admitting: Family

## 2022-08-10 DIAGNOSIS — E782 Mixed hyperlipidemia: Secondary | ICD-10-CM

## 2022-08-10 NOTE — Progress Notes (Signed)
Lipid

## 2022-08-24 ENCOUNTER — Other Ambulatory Visit: Payer: Managed Care, Other (non HMO)

## 2022-11-08 ENCOUNTER — Other Ambulatory Visit: Payer: Self-pay | Admitting: Family

## 2022-11-08 DIAGNOSIS — F32A Depression, unspecified: Secondary | ICD-10-CM

## 2023-02-04 ENCOUNTER — Other Ambulatory Visit: Payer: Self-pay | Admitting: Nurse Practitioner

## 2023-02-04 DIAGNOSIS — R030 Elevated blood-pressure reading, without diagnosis of hypertension: Secondary | ICD-10-CM

## 2023-02-28 ENCOUNTER — Encounter: Payer: Self-pay | Admitting: Family

## 2023-02-28 ENCOUNTER — Ambulatory Visit (INDEPENDENT_AMBULATORY_CARE_PROVIDER_SITE_OTHER): Payer: PRIVATE HEALTH INSURANCE | Admitting: Family

## 2023-02-28 VITALS — BP 128/78 | HR 92 | Temp 97.8°F | Ht 62.0 in | Wt 241.4 lb

## 2023-02-28 DIAGNOSIS — J029 Acute pharyngitis, unspecified: Secondary | ICD-10-CM

## 2023-02-28 DIAGNOSIS — J0110 Acute frontal sinusitis, unspecified: Secondary | ICD-10-CM | POA: Insufficient documentation

## 2023-02-28 DIAGNOSIS — Z20828 Contact with and (suspected) exposure to other viral communicable diseases: Secondary | ICD-10-CM | POA: Diagnosis not present

## 2023-02-28 DIAGNOSIS — Z20822 Contact with and (suspected) exposure to covid-19: Secondary | ICD-10-CM | POA: Diagnosis not present

## 2023-02-28 LAB — POCT RAPID STREP A (OFFICE): Rapid Strep A Screen: NEGATIVE

## 2023-02-28 LAB — POCT INFLUENZA A/B
Influenza A, POC: NEGATIVE
Influenza B, POC: NEGATIVE

## 2023-02-28 LAB — POC COVID19 BINAXNOW: SARS Coronavirus 2 Ag: NEGATIVE

## 2023-02-28 MED ORDER — AZITHROMYCIN 250 MG PO TABS: ORAL_TABLET | ORAL | 0 refills | Status: AC

## 2023-02-28 MED ORDER — METHYLPREDNISOLONE 4 MG PO TBPK: ORAL_TABLET | ORAL | 0 refills | Status: DC

## 2023-02-28 NOTE — Patient Instructions (Signed)
Start prescription and take as prescribed.  Tylenol/ibuprofen ok for sinus pain as needed Increase oral fluids. Ok to continue with humidifers and hot steamy showers as discussed during visit.  It was a pleasure speaking with you today, I hope you start feeling better soon.  Regards,   Eugenia Pancoast

## 2023-02-28 NOTE — Addendum Note (Signed)
Addended by: Eugenia Pancoast on: 02/28/2023 01:03 PM   Modules accepted: Orders

## 2023-02-28 NOTE — Progress Notes (Signed)
Established Patient Office Visit  Subjective:   Patient ID: Hannah Stephenson, female    DOB: [birth year]  Age: 47 y.o. MRN: [id number]  CC:  Chief Complaint  Patient presents with   Cough    HPI: Hannah Stephenson is a 47 y.o. female presenting on 02/28/2023 for Cough   Cough    Four days ago woke up with nasal congestion, and since has progressed to a productive cough. She is still with sinus pressure and rhinorrhea bil ear pain. Slight sore throat.  Temp got up to 99 F last night but broke since then.   Taking otc dayquil and nyquil.         ROS: Negative unless specifically indicated above in HPI.   Relevant past medical history reviewed and updated as indicated.   Allergies and medications reviewed and updated.   Current Outpatient Medications:    ALPRAZolam (XANAX) 0.25 MG tablet, TAKE 1 TABLET BY MOUTH ONCE DAILY AS NEEDED FOR ANXIETY, Disp: 30 tablet, Rfl: 0   azithromycin (ZITHROMAX) 250 MG tablet, Take 2 tablets on day 1, then 1 tablet daily on days 2 through 5, Disp: 6 tablet, Rfl: 0   fluticasone (FLONASE) 50 MCG/ACT nasal spray, Place 2 sprays into both nostrils daily., Disp: 16 g, Rfl: 6   loratadine (CLARITIN) 10 MG tablet, Take 10 mg by mouth daily., Disp: , Rfl:    losartan (COZAAR) 50 MG tablet, Take 1 tablet (50 mg total) by mouth daily., Disp: 90 tablet, Rfl: 1   methylPREDNISolone (MEDROL DOSEPAK) 4 MG TBPK tablet, Take per package instructions, Disp: 21 tablet, Rfl: 0   sertraline (ZOLOFT) 100 MG tablet, Take 1 tablet by mouth once daily, Disp: 90 tablet, Rfl: 0  Allergies  Allergen Reactions   Penicillins Itching    REACTION: feels hot, nauseated, and headache.    Contrast Media [Iodinated Contrast [place]     Per patient her mother told her she had a contrast allergy causing pancreatitis. I suspect her pancreatitis was due to cholelithiasis. She remembers it was for a test where they injected contrast to evaluate her GB and biliary tree.     Doxycycline Nausea And Vomiting    Objective:   BP 128/78   Pulse 92   Temp 97.8 F (36.6 C) (Temporal)   Ht 5\' 2"  (1.575 m)   Wt 241 lb 6.4 oz (109.5 kg)   LMP 05/18/2016   SpO2 98%   BMI 44.15 kg/m    Physical Exam Constitutional:      General: She is not in acute distress.    Appearance: Normal appearance. She is normal weight. She is not ill-appearing, toxic-appearing or diaphoretic.  HENT:     Head: Normocephalic.     Right Ear: Tympanic membrane normal.     Left Ear: Tympanic membrane normal.     Nose: Mucosal edema and congestion present.     Right Sinus: Maxillary sinus tenderness and frontal sinus tenderness present.     Left Sinus: Maxillary sinus tenderness and frontal sinus tenderness present.     Mouth/Throat:     Mouth: Mucous membranes are dry.     Pharynx: No oropharyngeal exudate or posterior oropharyngeal erythema.  Eyes:     Extraocular Movements: Extraocular movements intact.     Pupils: Pupils are equal, round, and reactive to light.  Cardiovascular:     Rate and Rhythm: Normal rate and regular rhythm.     Pulses: Normal pulses.     Heart sounds: Normal  heart sounds.  Pulmonary:     Effort: Pulmonary effort is normal.     Breath sounds: Normal breath sounds.  Musculoskeletal:     Cervical back: Normal range of motion.  Lymphadenopathy:     Cervical:     Right cervical: No superficial cervical adenopathy.    Left cervical: No superficial cervical adenopathy.  Neurological:     General: No focal deficit present.     Mental Status: She is alert and oriented to person, place, and time. Mental status is at baseline.  Psychiatric:        Mood and Affect: Mood normal.        Behavior: Behavior normal.        Thought Content: Thought content normal.        Judgment: Judgment normal.     Assessment & Plan:  Acute non-recurrent frontal sinusitis Assessment & Plan: Rx zpack 250 mg  Rx medrol dose pack  Pt to continue tylenol/ibuprofen prn sinus  pain. Continue with humidifier prn and steam showers recommended as well. instructed If no symptom improvement in 48 hours please f/u   Orders: -     Azithromycin; Take 2 tablets on day 1, then 1 tablet daily on days 2 through 5  Dispense: 6 tablet; Refill: 0 -     methylPREDNISolone; Take per package instructions  Dispense: 21 tablet; Refill: 0     Follow up plan: Return if symptoms worsen or fail to improve.  Eugenia Pancoast, FNP

## 2023-02-28 NOTE — Assessment & Plan Note (Signed)
Rx zpack 250 mg  Rx medrol dose pack  Pt to continue tylenol/ibuprofen prn sinus pain. Continue with humidifier prn and steam showers recommended as well. instructed If no symptom improvement in 48 hours please f/u

## 2023-04-17 ENCOUNTER — Other Ambulatory Visit: Payer: Self-pay | Admitting: Family

## 2023-04-17 ENCOUNTER — Other Ambulatory Visit: Payer: Self-pay | Admitting: Nurse Practitioner

## 2023-04-17 DIAGNOSIS — R030 Elevated blood-pressure reading, without diagnosis of hypertension: Secondary | ICD-10-CM

## 2023-04-17 DIAGNOSIS — F419 Anxiety disorder, unspecified: Secondary | ICD-10-CM

## 2023-04-18 NOTE — Telephone Encounter (Signed)
Dosage increased.

## 2024-02-16 ENCOUNTER — Other Ambulatory Visit: Payer: Self-pay | Admitting: Family

## 2024-02-16 DIAGNOSIS — F419 Anxiety disorder, unspecified: Secondary | ICD-10-CM

## 2024-02-28 ENCOUNTER — Encounter: Payer: Self-pay | Admitting: Family

## 2024-02-28 ENCOUNTER — Other Ambulatory Visit (INDEPENDENT_AMBULATORY_CARE_PROVIDER_SITE_OTHER): Payer: Self-pay

## 2024-02-28 ENCOUNTER — Ambulatory Visit (INDEPENDENT_AMBULATORY_CARE_PROVIDER_SITE_OTHER): Payer: PRIVATE HEALTH INSURANCE | Admitting: Family

## 2024-02-28 VITALS — BP 149/115 | HR 91 | Temp 97.9°F | Ht 62.0 in | Wt 238.4 lb

## 2024-02-28 DIAGNOSIS — I10 Essential (primary) hypertension: Secondary | ICD-10-CM

## 2024-02-28 DIAGNOSIS — J0110 Acute frontal sinusitis, unspecified: Secondary | ICD-10-CM | POA: Insufficient documentation

## 2024-02-28 DIAGNOSIS — J301 Allergic rhinitis due to pollen: Secondary | ICD-10-CM | POA: Diagnosis not present

## 2024-02-28 DIAGNOSIS — E782 Mixed hyperlipidemia: Secondary | ICD-10-CM

## 2024-02-28 DIAGNOSIS — E876 Hypokalemia: Secondary | ICD-10-CM | POA: Diagnosis not present

## 2024-02-28 DIAGNOSIS — D75839 Thrombocytosis, unspecified: Secondary | ICD-10-CM

## 2024-02-28 DIAGNOSIS — F419 Anxiety disorder, unspecified: Secondary | ICD-10-CM | POA: Diagnosis not present

## 2024-02-28 DIAGNOSIS — R7303 Prediabetes: Secondary | ICD-10-CM | POA: Diagnosis not present

## 2024-02-28 DIAGNOSIS — F32A Depression, unspecified: Secondary | ICD-10-CM

## 2024-02-28 LAB — CBC WITH DIFFERENTIAL/PLATELET
Basophils Absolute: 0 10*3/uL (ref 0.0–0.1)
Basophils Relative: 0.4 % (ref 0.0–3.0)
Eosinophils Absolute: 0.1 10*3/uL (ref 0.0–0.7)
Eosinophils Relative: 0.9 % (ref 0.0–5.0)
HCT: 42.9 % (ref 36.0–46.0)
Hemoglobin: 14.1 g/dL (ref 12.0–15.0)
Lymphocytes Relative: 23.8 % (ref 12.0–46.0)
Lymphs Abs: 2.6 10*3/uL (ref 0.7–4.0)
MCHC: 32.9 g/dL (ref 30.0–36.0)
MCV: 93.1 fl (ref 78.0–100.0)
Monocytes Absolute: 0.5 10*3/uL (ref 0.1–1.0)
Monocytes Relative: 4.4 % (ref 3.0–12.0)
Neutro Abs: 7.7 10*3/uL (ref 1.4–7.7)
Neutrophils Relative %: 70.5 % (ref 43.0–77.0)
Platelets: 405 10*3/uL — ABNORMAL HIGH (ref 150.0–400.0)
RBC: 4.61 Mil/uL (ref 3.87–5.11)
RDW: 13.8 % (ref 11.5–15.5)
WBC: 10.9 10*3/uL — ABNORMAL HIGH (ref 4.0–10.5)

## 2024-02-28 LAB — BASIC METABOLIC PANEL
BUN: 15 mg/dL (ref 6–23)
CO2: 28 meq/L (ref 19–32)
Calcium: 9.4 mg/dL (ref 8.4–10.5)
Chloride: 101 meq/L (ref 96–112)
Creatinine, Ser: 0.82 mg/dL (ref 0.40–1.20)
GFR: 83.73 mL/min (ref >60.00)
Glucose, Bld: 94 mg/dL (ref 70–99)
Potassium: 4.3 meq/L (ref 3.5–5.1)
Sodium: 139 meq/L (ref 135–145)

## 2024-02-28 LAB — LIPID PANEL
Cholesterol: 253 mg/dL — ABNORMAL HIGH (ref 0–200)
HDL: 49.7 mg/dL (ref >39.00)
LDL Cholesterol: 179 mg/dL — ABNORMAL HIGH (ref 0–99)
NonHDL: 202.96
Total CHOL/HDL Ratio: 5
Triglycerides: 118 mg/dL (ref 0.0–149.0)
VLDL: 23.6 mg/dL (ref 0.0–40.0)

## 2024-02-28 LAB — HEMOGLOBIN A1C: Hgb A1c MFr Bld: 6.3 % (ref 4.6–6.5)

## 2024-02-28 MED ORDER — SERTRALINE HCL 100 MG PO TABS
100.0000 mg | ORAL_TABLET | Freq: Every day | ORAL | 3 refills | Status: AC
Start: 2024-02-28 — End: ?

## 2024-02-28 MED ORDER — LOSARTAN POTASSIUM 50 MG PO TABS
50.0000 mg | ORAL_TABLET | Freq: Every day | ORAL | 3 refills | Status: AC
Start: 2024-02-28 — End: ?

## 2024-02-28 MED ORDER — CEFDINIR 300 MG PO CAPS: 300.0000 mg | ORAL_CAPSULE | Freq: Two times a day (BID) | ORAL | 0 refills | Status: AC

## 2024-02-28 NOTE — Addendum Note (Signed)
 Addended by: Vincenza Hews on: 02/28/2024 08:16 AM   Modules accepted: Orders

## 2024-02-28 NOTE — Progress Notes (Signed)
 Established Patient Office Visit  Subjective:      CC:  Chief Complaint  Patient presents with   Medical Management of Chronic Issues    Issues with BP. Lost her job and lost her insurance, could not afford medication. Also having issues with chronic headaches.    HPI: Hannah Stephenson is a 48 y.o. female presenting on 02/28/2024 for Medical Management of Chronic Issues (Issues with BP. Lost her job and lost her insurance, could not afford medication. Also having issues with chronic headaches.)  HTN: was on losartan 50 mg once daily but ran out because she was out of insurance. Probably has been close to a year. Denies cp palp and or sob. Has been trying to follow the blood pressure diet, eats chicken, [place], drinks more water cutting back on soft drinks. She has been focusing on a low sodium diet. Average blood pressure at home after resting for some time is around 149/80.   Chronic headaches: she does state she has posterior headaches that will reach around to her temporal areas. No nausea/vomiting associated with these.   Sinus symptoms have started a few weeks ago. She is taking flonase and claritin.  Sinus pressure in the face and frontal pressure. Rhinorrhea. With nasal congestion close to night time.   Anxiety depression: doing well, on sertraline 100 mg once daily.   Chronic cough, 'since covid' heartburn 'not often'. She does have allergies, has been on claritin for > 6 m. Also takes flonase.        Social history:  Relevant past medical, surgical, family and social history reviewed and updated as indicated. Interim medical history since our last visit reviewed.  Allergies and medications reviewed and updated.  DATA REVIEWED: CHART IN EPIC     ROS: Negative unless specifically indicated above in HPI.    Current Outpatient Medications:    ALPRAZolam (XANAX) 0.25 MG tablet, TAKE 1 TABLET BY MOUTH ONCE DAILY AS NEEDED FOR ANXIETY, Disp: 30 tablet, Rfl: 2    cefdinir (OMNICEF) 300 MG capsule, Take 1 capsule (300 mg total) by mouth 2 (two) times daily for 10 days., Disp: 20 capsule, Rfl: 0   fluticasone (FLONASE) 50 MCG/ACT nasal spray, Place 2 sprays into both nostrils daily., Disp: 16 g, Rfl: 6   loratadine (CLARITIN) 10 MG tablet, Take 10 mg by mouth daily., Disp: , Rfl:    losartan (COZAAR) 50 MG tablet, Take 1 tablet (50 mg total) by mouth daily., Disp: 90 tablet, Rfl: 3   sertraline (ZOLOFT) 100 MG tablet, Take 1 tablet (100 mg total) by mouth daily., Disp: 90 tablet, Rfl: 3      Objective:    BP (!) 149/115   Pulse 91   Temp 97.9 F (36.6 C) (Temporal)   Ht 5\' 2"  (1.575 m)   Wt 238 lb 6.4 oz (108.1 kg)   LMP 05/18/2016   SpO2 95%   BMI 43.60 kg/m   Wt Readings from Last 3 Encounters:  02/28/24 238 lb 6.4 oz (108.1 kg)  02/28/23 241 lb 6.4 oz (109.5 kg)  06/02/22 239 lb (108.4 kg)    Physical Exam Constitutional:      General: She is not in acute distress.    Appearance: Normal appearance. She is normal weight. She is not ill-appearing, toxic-appearing or diaphoretic.  HENT:     Head: Normocephalic.     Right Ear: Tympanic membrane normal.     Left Ear: Tympanic membrane normal.     Nose:  Right Turbinates: Swollen.     Right Sinus: Maxillary sinus tenderness and frontal sinus tenderness present.     Left Sinus: Maxillary sinus tenderness and frontal sinus tenderness present.     Mouth/Throat:     Mouth: Mucous membranes are dry.     Pharynx: Postnasal drip present. No oropharyngeal exudate or posterior oropharyngeal erythema.  Eyes:     Extraocular Movements: Extraocular movements intact.     Pupils: Pupils are equal, round, and reactive to light.  Cardiovascular:     Rate and Rhythm: Normal rate and regular rhythm.     Pulses: Normal pulses.     Heart sounds: Normal heart sounds.  Pulmonary:     Effort: Pulmonary effort is normal.     Breath sounds: Normal breath sounds.  Musculoskeletal:     Cervical back:  Normal range of motion.  Neurological:     General: No focal deficit present.     Mental Status: She is alert and oriented to person, place, and time. Mental status is at baseline.  Psychiatric:        Mood and Affect: Mood normal.        Behavior: Behavior normal.        Thought Content: Thought content normal.        Judgment: Judgment normal.           Assessment & Plan:  Hypokalemia -     Basic metabolic panel  Anxiety and depression -     Sertraline HCl; Take 1 tablet (100 mg total) by mouth daily.  Dispense: 90 tablet; Refill: 3  Primary hypertension Assessment & Plan: Not in control  Restart losartan 50 mg once daily  Dash diet Urine m/a ordered Pending results.  Pt advised of the following:  Continue medication as prescribed. Monitor blood pressure periodically and/or when you feel symptomatic. Goal is <130/90 on average. Ensure that you have rested for 30 minutes prior to checking your blood pressure. Record your readings and bring them to your next visit if necessary.work on a low sodium diet.   Orders: -     Losartan Potassium; Take 1 tablet (50 mg total) by mouth daily.  Dispense: 90 tablet; Refill: 3 -     Microalbumin / creatinine urine ratio  Seasonal allergic rhinitis due to pollen  Mixed hyperlipidemia Assessment & Plan: Ordered lipid panel, pending results. Work on low cholesterol diet and exercise as tolerated   Orders: -     Lipid panel  Prediabetes Assessment & Plan: Pt advised of the following: Work on a diabetic diet, try to incorporate exercise at least 20-30 a day for 3 days a week or more.    Orders: -     Hemoglobin A1c; Future  Thrombocytosis -     CBC with Differential/Platelet  Acute non-recurrent frontal sinusitis Assessment & Plan: Rx cefdinir 300 mg po bid x 10 days  Prescription given for augmentin 875/125 mg po bid for ten days. Pt to continue tylenol/ibuprofen prn sinus pain. Continue with humidifier prn and steam  showers recommended as well. instructed If no symptom improvement in 48 hours please f/u  Stop claritin  Start zyrtec Continue flonase   Orders: -     Cefdinir; Take 1 capsule (300 mg total) by mouth 2 (two) times daily for 10 days.  Dispense: 20 capsule; Refill: 0     Return in about 2 weeks (around 03/13/2024) for f/u CPE.  Mort Sawyers, MSN, APRN, FNP-C [facility]

## 2024-02-28 NOTE — Assessment & Plan Note (Signed)
 Pt advised of the following: Work on a diabetic diet, try to incorporate exercise at least 20-30 a day for 3 days a week or more.

## 2024-02-28 NOTE — Assessment & Plan Note (Signed)
 Not in control  Restart losartan 50 mg once daily  Dash diet Urine m/a ordered Pending results.  Pt advised of the following:  Continue medication as prescribed. Monitor blood pressure periodically and/or when you feel symptomatic. Goal is <130/90 on average. Ensure that you have rested for 30 minutes prior to checking your blood pressure. Record your readings and bring them to your next visit if necessary.work on a low sodium diet.

## 2024-02-28 NOTE — Assessment & Plan Note (Signed)
 Ordered lipid panel, pending results. Work on low cholesterol diet and exercise as tolerated

## 2024-02-28 NOTE — Progress Notes (Signed)
 noted

## 2024-02-28 NOTE — Assessment & Plan Note (Signed)
 Rx cefdinir 300 mg po bid x 10 days  Prescription given for augmentin 875/125 mg po bid for ten days. Pt to continue tylenol/ibuprofen prn sinus pain. Continue with humidifier prn and steam showers recommended as well. instructed If no symptom improvement in 48 hours please f/u  Stop claritin  Start zyrtec Continue flonase

## 2024-02-29 ENCOUNTER — Encounter: Payer: Self-pay | Admitting: Family

## 2024-02-29 LAB — MICROALBUMIN / CREATININE URINE RATIO
Creatinine,U: 165.8 mg/dL
Microalb Creat Ratio: 26.3 mg/g (ref 0.0–30.0)
Microalb, Ur: 4.4 mg/dL — ABNORMAL HIGH (ref 0.0–1.9)

## 2024-03-14 ENCOUNTER — Ambulatory Visit: Admitting: Family

## 2024-03-14 VITALS — BP 126/88 | HR 106 | Temp 98.0°F | Ht 62.0 in | Wt 238.4 lb

## 2024-03-14 DIAGNOSIS — E782 Mixed hyperlipidemia: Secondary | ICD-10-CM

## 2024-03-14 DIAGNOSIS — M62838 Other muscle spasm: Secondary | ICD-10-CM

## 2024-03-14 DIAGNOSIS — Z1211 Encounter for screening for malignant neoplasm of colon: Secondary | ICD-10-CM

## 2024-03-14 DIAGNOSIS — R7303 Prediabetes: Secondary | ICD-10-CM

## 2024-03-14 DIAGNOSIS — G44209 Tension-type headache, unspecified, not intractable: Secondary | ICD-10-CM

## 2024-03-14 DIAGNOSIS — F32A Depression, unspecified: Secondary | ICD-10-CM

## 2024-03-14 DIAGNOSIS — I10 Essential (primary) hypertension: Secondary | ICD-10-CM | POA: Diagnosis not present

## 2024-03-14 DIAGNOSIS — R2232 Localized swelling, mass and lump, left upper limb: Secondary | ICD-10-CM

## 2024-03-14 DIAGNOSIS — F419 Anxiety disorder, unspecified: Secondary | ICD-10-CM

## 2024-03-14 MED ORDER — BUTALBITAL-APAP-CAFFEINE 50-325-40 MG PO TABS: 1.0000 | ORAL_TABLET | Freq: Four times a day (QID) | ORAL | 0 refills | Status: AC | PRN

## 2024-03-14 MED ORDER — ATORVASTATIN CALCIUM 10 MG PO TABS: 10.0000 mg | ORAL_TABLET | Freq: Every day | ORAL | 3 refills | Status: AC

## 2024-03-14 MED ORDER — TIZANIDINE HCL 4 MG PO TABS: 4.0000 mg | ORAL_TABLET | Freq: Every day | ORAL | 0 refills | Status: AC

## 2024-03-14 MED ORDER — ALPRAZOLAM 0.25 MG PO TABS: 0.2500 mg | ORAL_TABLET | Freq: Every day | ORAL | 2 refills | Status: AC | PRN

## 2024-03-14 MED ORDER — IBUPROFEN 600 MG PO TABS: 600.0000 mg | ORAL_TABLET | Freq: Three times a day (TID) | ORAL | 0 refills | Status: AC | PRN

## 2024-03-14 NOTE — Progress Notes (Unsigned)
 Established Patient Office Visit  Subjective:      CC:  Chief Complaint  Patient presents with   Annual Exam    HPI: Hannah Stephenson is a 48 y.o. female presenting on 03/14/2024 for Annual Exam  HTN: average at home around 125-135/70 or so.  Back on losartan 50 mg once daily.  Headaches have improved slightly.   Headache, she does feel tension behind the ear and will wrap around the back of the head to the other ear, occurs about once a day. Still taking allergy medication, started xyzal and continuing with flonase. She is sleeping better with the xyzal. She also does take singulair as well. She does have a lot of neck tightness, does notice poor posture at her desk. Got a pillow to help with this as well. The headache will last until she is able to take her excedrin.  She will take excedrin tension and will go away in about thirty minutes it will go away. She does have blue light glasses for being on computer all day. She does work on some neck exercises.  She does note at times when she touches her scalp she has pressure that when touched causes 'pressure pain'. She can't wear a hat or someone touch or massage her head because it feels like there is too much pressure. She does state there is a slight improvement in this sensation however still present. No change in vision with all of this. No light sensitivity, no n/v. No peripheral neuralgias.   Urine microalbumin: positive, pt on losartan.   Pt is working on walking her dog daily Has stopped and limited her carb intake specifically past.  Eating a good amount of vegetables lots of chicken and fish and she is frustrated because weight doesn't seem to be changing.  She has taken away soft drinks.   She does get sinus infections twice yearly regularly.  She suffers from allergies often.   Has a knot on her left outer lateral forearm and at times will have achiness in her left forearm and if she holds her arm in a certain way she  will experience numbness down her left arm.   Social history:  Relevant past medical, surgical, family and social history reviewed and updated as indicated. Interim medical history since our last visit reviewed.  Allergies and medications reviewed and updated.  DATA REVIEWED: CHART IN EPIC     ROS: Negative unless specifically indicated above in HPI.    Current Outpatient Medications:    atorvastatin (LIPITOR) 10 MG tablet, Take 1 tablet (10 mg total) by mouth daily., Disp: 90 tablet, Rfl: 3   butalbital-acetaminophen-caffeine (FIORICET) 50-325-40 MG tablet, Take 1 tablet by mouth every 6 (six) hours as needed for headache., Disp: 14 tablet, Rfl: 0   ibuprofen (ADVIL) 600 MG tablet, Take 1 tablet (600 mg total) by mouth every 8 (eight) hours as needed., Disp: 30 tablet, Rfl: 0   tiZANidine (ZANAFLEX) 4 MG tablet, Take 1 tablet (4 mg total) by mouth at bedtime., Disp: 30 tablet, Rfl: 0   ALPRAZolam (XANAX) 0.25 MG tablet, Take 1 tablet (0.25 mg total) by mouth daily as needed for anxiety., Disp: 30 tablet, Rfl: 2   fluticasone (FLONASE) 50 MCG/ACT nasal spray, Place 2 sprays into both nostrils daily., Disp: 16 g, Rfl: 6   loratadine (CLARITIN) 10 MG tablet, Take 10 mg by mouth daily., Disp: , Rfl:    losartan (COZAAR) 50 MG tablet, Take 1 tablet (50 mg total)  by mouth daily., Disp: 90 tablet, Rfl: 3   sertraline (ZOLOFT) 100 MG tablet, Take 1 tablet (100 mg total) by mouth daily., Disp: 90 tablet, Rfl: 3      Objective:    BP 126/88 (BP Location: Right Arm, Patient Position: Sitting, Cuff Size: Large)   Pulse (!) 106   Temp 98 F (36.7 C) (Temporal)   Ht 5\' 2"  (1.575 m)   Wt 238 lb 6.4 oz (108.1 kg)   LMP 05/18/2016   SpO2 93%   BMI 43.60 kg/m   Wt Readings from Last 3 Encounters:  03/14/24 238 lb 6.4 oz (108.1 kg)  02/28/24 238 lb 6.4 oz (108.1 kg)  02/28/23 241 lb 6.4 oz (109.5 kg)    Physical Exam Vitals reviewed.  Constitutional:      General: She is not in acute  distress.    Appearance: Normal appearance. She is normal weight. She is not ill-appearing, toxic-appearing or diaphoretic.  HENT:     Head: Normocephalic.     Right Ear: Tympanic membrane normal.     Left Ear: Tympanic membrane normal.     Nose: Nose normal.     Right Turbinates: Swollen.     Left Turbinates: Swollen.     Right Sinus: No maxillary sinus tenderness or frontal sinus tenderness.     Left Sinus: No maxillary sinus tenderness or frontal sinus tenderness.     Mouth/Throat:     Mouth: Mucous membranes are dry.     Pharynx: No oropharyngeal exudate or posterior oropharyngeal erythema.  Eyes:     Extraocular Movements: Extraocular movements intact.     Pupils: Pupils are equal, round, and reactive to light.  Cardiovascular:     Rate and Rhythm: Normal rate and regular rhythm.     Pulses: Normal pulses.     Heart sounds: Normal heart sounds.  Pulmonary:     Effort: Pulmonary effort is normal.     Breath sounds: Normal breath sounds.  Musculoskeletal:     Cervical back: Normal range of motion.  Lymphadenopathy:     Head:     Right side of head: Occipital adenopathy present.     Left side of head: Occipital adenopathy present.  Neurological:     General: No focal deficit present.     Mental Status: She is alert and oriented to person, place, and time. Mental status is at baseline.  Psychiatric:        Mood and Affect: Mood normal.        Behavior: Behavior normal.        Thought Content: Thought content normal.        Judgment: Judgment normal.    No scalp tenderness today on palpation        Assessment & Plan:  Tension headache Assessment & Plan: Ibuprofen as needed Trial fioricet advised to work on neck exercises  Heat to site muscle relaxer as needed  Orders: -     Ibuprofen; Take 1 tablet (600 mg total) by mouth every 8 (eight) hours as needed.  Dispense: 30 tablet; Refill: 0 -     Butalbital-APAP-Caffeine; Take 1 tablet by mouth every 6 (six) hours as  needed for headache.  Dispense: 14 tablet; Refill: 0  Muscle spasms of neck -     tiZANidine HCl; Take 1 tablet (4 mg total) by mouth at bedtime.  Dispense: 30 tablet; Refill: 0  Mixed hyperlipidemia Assessment & Plan: Cont atorvastatin 10 mg nightly  Ordered lipid panel, pending results.  Work on low cholesterol diet and exercise as tolerated   Orders: -     Atorvastatin Calcium; Take 1 tablet (10 mg total) by mouth daily.  Dispense: 90 tablet; Refill: 3 -     Amb Ref to Medical Weight Management  Primary hypertension Assessment & Plan: Stable.    Orders: -     Amb Ref to Medical Weight Management  Prediabetes -     Amb Ref to Medical Weight Management  Anxiety and depression Assessment & Plan: stable  Orders: -     ALPRAZolam; Take 1 tablet (0.25 mg total) by mouth daily as needed for anxiety.  Dispense: 30 tablet; Refill: 2  Morbid obesity (HCC) Assessment & Plan: Pt advised to work on diet and exercise as tolerated Discussed options, pt open to referral to healthy weight and wellness  Orders: -     Amb Ref to Medical Weight Management  Forearm mass, left  Screening for colon cancer -     Ambulatory referral to Gastroenterology     Return in about 3 months (around 06/13/2024) for f/u CPE.  Mort Sawyers, MSN, APRN, FNP-C [facility]

## 2024-03-15 ENCOUNTER — Telehealth: Payer: Self-pay

## 2024-03-15 ENCOUNTER — Other Ambulatory Visit: Payer: Self-pay

## 2024-03-15 DIAGNOSIS — Z1211 Encounter for screening for malignant neoplasm of colon: Secondary | ICD-10-CM

## 2024-03-15 MED ORDER — NA SULFATE-K SULFATE-MG SULF 17.5-3.13-1.6 GM/177ML PO SOLN: 1.0000 | Freq: Once | ORAL | 0 refills | Status: AC

## 2024-03-15 NOTE — Telephone Encounter (Signed)
 Gastroenterology Pre-Procedure Review  Request Date: 04/05/24 Requesting Physician: Dr. Servando Snare  PATIENT REVIEW QUESTIONS: The patient responded to the following health history questions as indicated:    1. Are you having any GI issues? no 2. Do you have a personal history of Polyps? no 3. Do you have a family history of Colon Cancer or Polyps? no 4. Diabetes Mellitus? no 5. Joint replacements in the past 12 months?no 6. Major health problems in the past 3 months?no 7. Any artificial heart valves, MVP, or defibrillator?no    MEDICATIONS & ALLERGIES:    Patient reports the following regarding taking any anticoagulation/antiplatelet therapy:   Plavix, Coumadin, Eliquis, Xarelto, Lovenox, Pradaxa, Brilinta, or Effient? no Aspirin? no  Patient confirms/reports the following medications:  Current Outpatient Medications  Medication Sig Dispense Refill   ALPRAZolam (XANAX) 0.25 MG tablet Take 1 tablet (0.25 mg total) by mouth daily as needed for anxiety. 30 tablet 2   atorvastatin (LIPITOR) 10 MG tablet Take 1 tablet (10 mg total) by mouth daily. 90 tablet 3   butalbital-acetaminophen-caffeine (FIORICET) 50-325-40 MG tablet Take 1 tablet by mouth every 6 (six) hours as needed for headache. 14 tablet 0   fluticasone (FLONASE) 50 MCG/ACT nasal spray Place 2 sprays into both nostrils daily. 16 g 6   ibuprofen (ADVIL) 600 MG tablet Take 1 tablet (600 mg total) by mouth every 8 (eight) hours as needed. 30 tablet 0   loratadine (CLARITIN) 10 MG tablet Take 10 mg by mouth daily.     losartan (COZAAR) 50 MG tablet Take 1 tablet (50 mg total) by mouth daily. 90 tablet 3   sertraline (ZOLOFT) 100 MG tablet Take 1 tablet (100 mg total) by mouth daily. 90 tablet 3   tiZANidine (ZANAFLEX) 4 MG tablet Take 1 tablet (4 mg total) by mouth at bedtime. 30 tablet 0   No current facility-administered medications for this visit.    Patient confirms/reports the following allergies:  Allergies  Allergen  Reactions   Penicillins Itching    REACTION: feels hot, nauseated, and headache.    Contrast Media [Iodinated Contrast Media]     Per patient her mother told her she had a contrast allergy causing pancreatitis. I suspect her pancreatitis was due to cholelithiasis. She remembers it was for a test where they injected contrast to evaluate her GB and biliary tree.    Doxycycline Nausea And Vomiting    No orders of the defined types were placed in this encounter.   AUTHORIZATION INFORMATION Primary Insurance: 1D#: Group #:  Secondary Insurance: 1D#: Group #:  SCHEDULE INFORMATION: Date:  Time: Location:

## 2024-03-16 DIAGNOSIS — R2232 Localized swelling, mass and lump, left upper limb: Secondary | ICD-10-CM | POA: Insufficient documentation

## 2024-03-16 DIAGNOSIS — G44209 Tension-type headache, unspecified, not intractable: Secondary | ICD-10-CM | POA: Insufficient documentation

## 2024-03-16 NOTE — Assessment & Plan Note (Signed)
 Cont atorvastatin 10 mg nightly  Ordered lipid panel, pending results. Work on low cholesterol diet and exercise as tolerated

## 2024-03-16 NOTE — Assessment & Plan Note (Signed)
 Stable

## 2024-03-16 NOTE — Assessment & Plan Note (Signed)
 Ibuprofen as needed Trial fioricet advised to work on neck exercises  Heat to site muscle relaxer as needed

## 2024-03-16 NOTE — Assessment & Plan Note (Signed)
 Pt advised to work on diet and exercise as tolerated Discussed options, pt open to referral to healthy weight and wellness

## 2024-03-16 NOTE — Assessment & Plan Note (Signed)
 stable

## 2024-04-03 ENCOUNTER — Telehealth: Payer: Self-pay

## 2024-04-03 NOTE — Telephone Encounter (Signed)
 Patient has requested to reschedule her colonoscopy due to work schedule.  Colonoscopy has been rescheduled to 05/17/24.  Endo notified.  Referral updated. Instructions updated.  Thanks, [place], [place]

## 2024-04-03 NOTE — Telephone Encounter (Signed)
 Pt requesting call back to reschedule colonoscopy

## 2024-04-23 ENCOUNTER — Encounter (INDEPENDENT_AMBULATORY_CARE_PROVIDER_SITE_OTHER): Admitting: Family Medicine

## 2024-05-17 ENCOUNTER — Ambulatory Visit: Admission: RE | Admit: 2024-05-17 | Source: Home / Self Care | Admitting: Gastroenterology

## 2024-05-17 ENCOUNTER — Encounter: Payer: Self-pay | Admitting: Anesthesiology

## 2024-05-17 SURGERY — COLONOSCOPY
Anesthesia: General

## 2024-05-17 NOTE — Anesthesia Preprocedure Evaluation (Signed)
 Anesthesia Evaluation  Patient identified by MRN, date of birth, ID band Patient awake    Reviewed: Allergy & Precautions, H&P , NPO status , Patient's Chart, lab work & pertinent test results  Airway Mallampati: II  TM Distance: >3 FB Neck ROM: full    Dental no notable dental hx.    Pulmonary neg pulmonary ROS   Pulmonary exam normal        Cardiovascular hypertension, Normal cardiovascular exam     Neuro/Psych  PSYCHIATRIC DISORDERS Anxiety     negative neurological ROS     GI/Hepatic negative GI ROS, Neg liver ROS,,,  Endo/Other    Class 3 obesity  Renal/GU negative Renal ROS  negative genitourinary   Musculoskeletal   Abdominal   Peds  Hematology negative hematology ROS (+)   Anesthesia Other Findings Past Medical History: No date: Anxiety No date: Depression No date: History of infection due to human papilloma virus (HPV) No date: History of pancreatitis No date: Migraines No date: OCD (obsessive compulsive disorder) No date: Scoliosis     Comment:  mild No date: Wrist fracture, left  Past Surgical History: No date: CESAREAN SECTION 06/01/2016: CYSTOSCOPY     Comment:  Procedure: CYSTOSCOPY;  Surgeon: Alben Alma, MD;                Location: [facility] ORS;  Service: Gynecology;; No date: DILATION AND CURETTAGE OF UTERUS No date: lap hysterectomy partial     Comment:  still with bil ovaries 06/01/2016: LAPAROSCOPIC HYSTERECTOMY; Bilateral     Comment:  Procedure: HYSTERECTOMY TOTAL LAPAROSCOPIC/ BILATERAL               SALPINGECTOMY;  Surgeon: Alben Alma, MD;  Location:               [facility] ORS;  Service: Gynecology;  Laterality: Bilateral; No date: PILONIDAL CYST EXCISION No date: UPPER GI ENDOSCOPY No date: WISDOM TOOTH EXTRACTION     Reproductive/Obstetrics negative OB ROS                              Anesthesia Physical Anesthesia Plan  ASA: 2  Anesthesia  Plan: General   Post-op Pain Management:    Induction: Intravenous  PONV Risk Score and Plan: Propofol  infusion and TIVA  Airway Management Planned:   Additional Equipment:   Intra-op Plan:   Post-operative Plan:   Informed Consent:      Dental Advisory Given  Plan Discussed with: CRNA and Surgeon  Anesthesia Plan Comments:          Anesthesia Quick Evaluation

## 2024-06-13 ENCOUNTER — Encounter: Payer: Self-pay | Admitting: Family

## 2024-07-30 ENCOUNTER — Encounter: Payer: Self-pay | Admitting: *Deleted
